# Patient Record
Sex: Male | Born: 1950 | Race: White | Hispanic: No | Marital: Single
Health system: Southern US, Community
[De-identification: ages and names within clinical notes are randomized; demographics above are authoritative.]

## PROBLEM LIST (undated history)

## (undated) DIAGNOSIS — I482 Chronic atrial fibrillation, unspecified: Secondary | ICD-10-CM

## (undated) DIAGNOSIS — J1282 Pneumonia due to coronavirus disease 2019: Secondary | ICD-10-CM

## (undated) DIAGNOSIS — J9621 Acute and chronic respiratory failure with hypoxia: Secondary | ICD-10-CM

## (undated) DIAGNOSIS — N179 Acute kidney failure, unspecified: Secondary | ICD-10-CM

## (undated) DIAGNOSIS — U071 COVID-19: Secondary | ICD-10-CM

---

## 2019-07-28 ENCOUNTER — Other Ambulatory Visit (HOSPITAL_COMMUNITY): Payer: Medicare Other

## 2019-07-28 ENCOUNTER — Inpatient Hospital Stay
Admission: AD | Admit: 2019-07-28 | Discharge: 2019-08-27 | Disposition: A | Discharge status: Rehabilitation Facility | Payer: Medicare Other | Source: Other Acute Inpatient Hospital | Attending: Internal Medicine | Admitting: Internal Medicine

## 2019-07-28 DIAGNOSIS — N179 Acute kidney failure, unspecified: Secondary | ICD-10-CM | POA: Diagnosis present

## 2019-07-28 DIAGNOSIS — J189 Pneumonia, unspecified organism: Secondary | ICD-10-CM

## 2019-07-28 DIAGNOSIS — J9690 Respiratory failure, unspecified, unspecified whether with hypoxia or hypercapnia: Secondary | ICD-10-CM

## 2019-07-28 DIAGNOSIS — J1282 Pneumonia due to coronavirus disease 2019: Secondary | ICD-10-CM | POA: Diagnosis present

## 2019-07-28 DIAGNOSIS — Z431 Encounter for attention to gastrostomy: Secondary | ICD-10-CM

## 2019-07-28 DIAGNOSIS — U071 COVID-19: Secondary | ICD-10-CM | POA: Diagnosis present

## 2019-07-28 DIAGNOSIS — J9621 Acute and chronic respiratory failure with hypoxia: Secondary | ICD-10-CM | POA: Diagnosis present

## 2019-07-28 DIAGNOSIS — I482 Chronic atrial fibrillation, unspecified: Secondary | ICD-10-CM | POA: Diagnosis present

## 2019-07-28 HISTORY — DX: Acute kidney failure, unspecified: N17.9

## 2019-07-28 HISTORY — DX: COVID-19: U07.1

## 2019-07-28 HISTORY — DX: Acute and chronic respiratory failure with hypoxia: J96.21

## 2019-07-28 HISTORY — DX: Pneumonia due to coronavirus disease 2019: J12.82

## 2019-07-28 HISTORY — DX: Chronic atrial fibrillation, unspecified: I48.20

## 2019-07-28 LAB — BLOOD GAS, ARTERIAL
Acid-Base Excess: 11.6 mmol/L — ABNORMAL HIGH (ref 0.0–2.0)
Bicarbonate: 35 mmol/L — ABNORMAL HIGH (ref 20.0–28.0)
FIO2: 45
O2 Saturation: 99.2 %
Patient temperature: 36
pCO2 arterial: 37.5 mmHg (ref 32.0–48.0)
pH, Arterial: 7.573 — ABNORMAL HIGH (ref 7.350–7.450)
pO2, Arterial: 113 mmHg — ABNORMAL HIGH (ref 83.0–108.0)

## 2019-07-29 DIAGNOSIS — J9621 Acute and chronic respiratory failure with hypoxia: Secondary | ICD-10-CM

## 2019-07-29 DIAGNOSIS — J1289 Other viral pneumonia: Secondary | ICD-10-CM

## 2019-07-29 DIAGNOSIS — U071 COVID-19: Secondary | ICD-10-CM

## 2019-07-29 DIAGNOSIS — N179 Acute kidney failure, unspecified: Secondary | ICD-10-CM

## 2019-07-29 DIAGNOSIS — I482 Chronic atrial fibrillation, unspecified: Secondary | ICD-10-CM | POA: Diagnosis not present

## 2019-07-29 LAB — CBC WITH DIFFERENTIAL/PLATELET
Abs Immature Granulocytes: 0.11 10*3/uL — ABNORMAL HIGH (ref 0.00–0.07)
Basophils Absolute: 0.1 10*3/uL (ref 0.0–0.1)
Basophils Relative: 1 %
Eosinophils Absolute: 0.8 10*3/uL — ABNORMAL HIGH (ref 0.0–0.5)
Eosinophils Relative: 7 %
HCT: 33.2 % — ABNORMAL LOW (ref 39.0–52.0)
Hemoglobin: 10.2 g/dL — ABNORMAL LOW (ref 13.0–17.0)
Immature Granulocytes: 1 %
Lymphocytes Relative: 10 %
Lymphs Abs: 1.3 10*3/uL (ref 0.7–4.0)
MCH: 30.6 pg (ref 26.0–34.0)
MCHC: 30.7 g/dL (ref 30.0–36.0)
MCV: 99.7 fL (ref 80.0–100.0)
Monocytes Absolute: 1.7 10*3/uL — ABNORMAL HIGH (ref 0.1–1.0)
Monocytes Relative: 13 %
Neutro Abs: 8.9 10*3/uL — ABNORMAL HIGH (ref 1.7–7.7)
Neutrophils Relative %: 68 %
Platelets: 301 10*3/uL (ref 150–400)
RBC: 3.33 MIL/uL — ABNORMAL LOW (ref 4.22–5.81)
RDW: 15.9 % — ABNORMAL HIGH (ref 11.5–15.5)
WBC: 13 10*3/uL — ABNORMAL HIGH (ref 4.0–10.5)
nRBC: 0 % (ref 0.0–0.2)

## 2019-07-29 LAB — COMPREHENSIVE METABOLIC PANEL
ALT: 62 U/L — ABNORMAL HIGH (ref 0–44)
AST: 64 U/L — ABNORMAL HIGH (ref 15–41)
Albumin: 2.1 g/dL — ABNORMAL LOW (ref 3.5–5.0)
Alkaline Phosphatase: 396 U/L — ABNORMAL HIGH (ref 38–126)
Anion gap: 15 (ref 5–15)
BUN: 40 mg/dL — ABNORMAL HIGH (ref 8–23)
CO2: 29 mmol/L (ref 22–32)
Calcium: 8.7 mg/dL — ABNORMAL LOW (ref 8.9–10.3)
Chloride: 99 mmol/L (ref 98–111)
Creatinine, Ser: 1.08 mg/dL (ref 0.61–1.24)
GFR calc Af Amer: >60 mL/min (ref >60)
GFR calc non Af Amer: >60 mL/min (ref >60)
Glucose, Bld: 89 mg/dL (ref 70–99)
Potassium: 4.2 mmol/L (ref 3.5–5.1)
Sodium: 143 mmol/L (ref 135–145)
Total Bilirubin: 0.8 mg/dL (ref 0.3–1.2)
Total Protein: 6.5 g/dL (ref 6.5–8.1)

## 2019-07-29 LAB — HEMOGLOBIN A1C
Hgb A1c MFr Bld: 6.5 % — ABNORMAL HIGH (ref 4.8–5.6)
Mean Plasma Glucose: 139.85 mg/dL

## 2019-07-29 NOTE — Consult Note (Signed)
[facility]  PULMONARY SERVICE  Date of Service: 07/29/2019  PULMONARY CRITICAL CARE CONSULT   CARLE FENECH  NID:[id number]  DOB: 1951/05/02   DOA: 07/28/2019  Referring Physician: Merton Border, MD  HPI: Kevin Middleton is a 68 y.o. male seen for follow up of Acute on Chronic Respiratory Failure.  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  Patient was found to have multifocal pneumonia and was eventually diagnosed with COVID-19 pneumonia.  Patient also was noted at that time to have atrial fibrillation with rapid ventricular response has not been on anticoagulation as an outpatient.  Patient was treated with remdesivir Decadron and also did receive convalescent plasma.  Patient was not able to be weaned and extubated.  Patient's oxygenation had somewhat improved failed an attempt at extubation.  Other issues that came up included developing bacterial infection for which the patient was treated for this with Zyvox as well as meropenem.  Patient had been apparently weaned down to CPAP sent to [place] for further management and weaning.  Review of Systems:  ROS performed and is unremarkable other than noted above.  Past medical history: Dementia Type 2 diabetes Hypertension Pituitary adenoma Diabetes insipidus Adrenal insufficiency Hypothyroidism Chronic atrial fibrillation COVID-19 pneumonia  Past surgical history: Tracheostomy PEG tube placement Transsphenoidal pituitary resection  Social history: Patient has dementia Unknown about smoking alcohol use  Family history: Noncontributory to the present illness  Allergies: Reviewed on the electronic record  Medications: Reviewed on Rounds  Physical Exam:  Vitals: Temperature 96.0  pulse 80 respiratory rate 27 blood pressure 109/69 saturations 98%  Ventilator Settings mode ventilation assist control FiO2 is 40% tidal line 500 PEEP 5  . General: Comfortable at this time . Eyes: Grossly normal lids, irises & conjunctiva . ENT: grossly tongue is normal . Neck: no obvious mass . Cardiovascular: S1-S2 normal no gallop or rub . Respiratory: No rhonchi no rales are noted at this time . Abdomen: Soft and nontender . Skin: no rash seen on limited exam . Musculoskeletal: not rigid . Psychiatric:unable to assess . Neurologic: no seizure no involuntary movements         Labs on Admission:  Basic Metabolic Panel: No results for input(s): NA, K, CL, CO2, GLUCOSE, BUN, CREATININE, CALCIUM, MG, PHOS in the last 168 hours.  Recent Labs  Lab 07/28/19 1805  PHART 7.573*  PCO2ART 37.5  PO2ART 113*  HCO3 35.0*  O2SAT 99.2    Liver Function Tests: No results for input(s): AST, ALT, ALKPHOS, BILITOT, PROT, ALBUMIN in the last 168 hours. No results for input(s): LIPASE, AMYLASE in the last 168 hours. No results for input(s): AMMONIA in the last 168 hours.  CBC: No results for input(s): WBC, NEUTROABS, HGB, HCT, MCV, PLT in the last 168 hours.  Cardiac Enzymes: No results for input(s): CKTOTAL, CKMB, CKMBINDEX, TROPONINI in the last 168 hours.  BNP (last 3 results) No results for input(s): BNP in the last 8760 hours.  ProBNP (last 3 results) No results for input(s): PROBNP in the last 8760 hours.   Radiological Exams on Admission: Dg Abdomen Peg Tube Location  Result Date: 07/28/2019 CLINICAL DATA:  Peg tube placement EXAM: ABDOMEN - 1 VIEW COMPARISON:  None. FINDINGS: Injection of contrast through the peg tube appears to opacify the proximal small bowel and gastric antrum. The stomach is not  well appreciated on this exam. The bowel gas pattern is nonobstructive. Oral contrast is noted in the colon. IMPRESSION: Injection of contrast through the pre-existing PEG tube  opacifies the proximal small bowel and gastric antrum. The stomach is not well appreciated on this exam and is likely outside the field of view. Short interval repeat radiograph focusing on the upper abdomen may be useful if there is clinical concern for a malpositioned PEG tube. Electronically Signed   By: Katherine Mantle M.D.   On: 07/28/2019 18:36   Dg Chest Port 1 View  Result Date: 07/28/2019 CLINICAL DATA:  Viral pneumonia EXAM: PORTABLE CHEST 1 VIEW COMPARISON:  None. FINDINGS: The tracheostomy tube terminates above the carina. The left-sided PICC line terminates in the SVC. Scattered coarse airspace opacities are noted bilaterally. There are small bilateral pleural effusions, right greater than left. The heart size is enlarged. There is no pneumothorax. No acute osseous abnormality. IMPRESSION: 1. Lines and tubes as above. 2. Coarse lung markings and airspace opacities are noted bilaterally consistent with the patient's history of viral pneumonia. 3. Small bilateral pleural effusions, right greater than left. 4. Cardiomegaly. Electronically Signed   By: Katherine Mantle M.D.   On: 07/28/2019 18:34    Assessment/Plan Active Problems:   Acute on chronic respiratory failure with hypoxemia (HCC)   COVID-19 virus infection   Pneumonia due to COVID-19 virus   Chronic atrial fibrillation (HCC)   Acute renal failure (ARF) (HCC)   1. Acute on chronic respiratory failure hypoxia patient will be assessed with the RSB I to be checked today and try to start with.  Chest x-ray was reviewed still showing significant infiltrates consistent with COVID-19. 2. COVID-19 virus infection in recovery phase we will continue with supportive care 3. COVID-19 viral pneumonia treated chest x-ray still showing residual deficits.  We will continue to monitor 4. Chronic atrial fibrillation by history continue to monitor closely. 5. Acute renal failure we will continue to monitor the labs closely patient had been  seen by nephrology at the other facility  I have personally seen and evaluated the patient, evaluated laboratory and imaging results, formulated the assessment and plan and placed orders. The Patient requires high complexity decision making for assessment and support.  Case was discussed on Rounds with the Respiratory Therapy Staff Time Spent  Yevonne Pax, MD [facility] Pulmonary Critical Care Medicine Sleep Medicine

## 2019-07-30 ENCOUNTER — Encounter: Payer: Self-pay | Admitting: Internal Medicine

## 2019-07-30 DIAGNOSIS — I482 Chronic atrial fibrillation, unspecified: Secondary | ICD-10-CM | POA: Diagnosis not present

## 2019-07-30 DIAGNOSIS — J9621 Acute and chronic respiratory failure with hypoxia: Secondary | ICD-10-CM | POA: Diagnosis present

## 2019-07-30 DIAGNOSIS — U071 COVID-19: Secondary | ICD-10-CM | POA: Diagnosis not present

## 2019-07-30 DIAGNOSIS — J1282 Pneumonia due to coronavirus disease 2019: Secondary | ICD-10-CM | POA: Diagnosis present

## 2019-07-30 DIAGNOSIS — N179 Acute kidney failure, unspecified: Secondary | ICD-10-CM | POA: Diagnosis not present

## 2019-07-30 NOTE — Progress Notes (Addendum)
Pulmonary Critical Care Medicine [facility] [place]   PULMONARY CRITICAL CARE SERVICE  PROGRESS NOTE  Date of Service: 07/30/2019  Kevin Middleton  PNT:[id number]  DOB: 11-Jan-1951   DOA: 07/28/2019  Referring Physician: Carron Curie, MD  HPI: Kevin Middleton is a 68 y.o. male seen for follow up of Acute on Chronic Respiratory Failure.  Patient today is on full support on the ventilator has yet to be weaned  Medications: Reviewed on Rounds  Physical Exam:  Vitals: Temperature 96.7 pulse 85 respiratory rate 32 blood pressure 143/79 saturations 99%  Ventilator Settings mode ventilation assist control FiO2 40% tidal volume 500 PEEP 5  . General: Comfortable at this time . Eyes: Grossly normal lids, irises & conjunctiva . ENT: grossly tongue is normal . Neck: no obvious mass . Cardiovascular: S1 S2 normal no gallop . Respiratory: No rhonchi no rales are noted at this time . Abdomen: soft . Skin: no rash seen on limited exam . Musculoskeletal: not rigid . Psychiatric:unable to assess . Neurologic: no seizure no involuntary movements         Lab Data:   Basic Metabolic Panel: Recent Labs  Lab 07/29/19 0619  NA 143  K 4.2  CL 99  CO2 29  GLUCOSE 89  BUN 40*  CREATININE 1.08  CALCIUM 8.7*    ABG: Recent Labs  Lab 07/28/19 1805  PHART 7.573*  PCO2ART 37.5  PO2ART 113*  HCO3 35.0*  O2SAT 99.2    Liver Function Tests: Recent Labs  Lab 07/29/19 0619  AST 64*  ALT 62*  ALKPHOS 396*  BILITOT 0.8  PROT 6.5  ALBUMIN 2.1*   No results for input(s): LIPASE, AMYLASE in the last 168 hours. No results for input(s): AMMONIA in the last 168 hours.  CBC: Recent Labs  Lab 07/29/19 0619  WBC 13.0*  NEUTROABS 8.9*  HGB 10.2*  HCT 33.2*  MCV 99.7  PLT 301    Cardiac Enzymes: No results for input(s): CKTOTAL, CKMB, CKMBINDEX, TROPONINI in the last 168 hours.  BNP (last 3 results) No results for input(s): BNP in the last 8760  hours.  ProBNP (last 3 results) No results for input(s): PROBNP in the last 8760 hours.  Radiological Exams: Dg Abdomen Peg Tube Location  Result Date: 07/28/2019 CLINICAL DATA:  Peg tube placement EXAM: ABDOMEN - 1 VIEW COMPARISON:  None. FINDINGS: Injection of contrast through the peg tube appears to opacify the proximal small bowel and gastric antrum. The stomach is not well appreciated on this exam. The bowel gas pattern is nonobstructive. Oral contrast is noted in the colon. IMPRESSION: Injection of contrast through the pre-existing PEG tube opacifies the proximal small bowel and gastric antrum. The stomach is not well appreciated on this exam and is likely outside the field of view. Short interval repeat radiograph focusing on the upper abdomen may be useful if there is clinical concern for a malpositioned PEG tube. Electronically Signed   By: Katherine Mantle M.D.   On: 07/28/2019 18:36   Dg Chest Port 1 View  Result Date: 07/28/2019 CLINICAL DATA:  Viral pneumonia EXAM: PORTABLE CHEST 1 VIEW COMPARISON:  None. FINDINGS: The tracheostomy tube terminates above the carina. The left-sided PICC line terminates in the SVC. Scattered coarse airspace opacities are noted bilaterally. There are small bilateral pleural effusions, right greater than left. The heart size is enlarged. There is no pneumothorax. No acute osseous abnormality. IMPRESSION: 1. Lines and tubes as above. 2. Coarse lung markings and airspace opacities  are noted bilaterally consistent with the patient's history of viral pneumonia. 3. Small bilateral pleural effusions, right greater than left. 4. Cardiomegaly. Electronically Signed   By: Constance Holster M.D.   On: 07/28/2019 18:34    Assessment/Plan Active Problems:   Acute on chronic respiratory failure with hypoxemia (HCC)   COVID-19 virus infection   Pneumonia due to COVID-19 virus   Chronic atrial fibrillation (HCC)   Acute renal failure (ARF) ([place])   1. Acute on  chronic respiratory failure with hypoxia we will continue with full support on the ventilator.  Respiratory therapy will check the RSB I did check for weaning readiness 2. COVID-19 virus infection in recovery phase chest x-ray still showing some airspace opacities 3. Pneumonia due to COVID-19 as above residual deficits still noted on the chest x-ray 4. Acute renal failure we will continue to monitor the renal function 5. Chronic atrial fibrillation rate is controlled continue to monitor   I have personally seen and evaluated the patient, evaluated laboratory and imaging results, formulated the assessment and plan and placed orders. The Patient requires high complexity decision making for assessment and support.  Case was discussed on Rounds with the Respiratory Therapy Staff  Allyne Gee, MD [facility] Pulmonary Critical Care Medicine Sleep Medicine

## 2019-07-31 DIAGNOSIS — U071 COVID-19: Secondary | ICD-10-CM | POA: Diagnosis not present

## 2019-07-31 DIAGNOSIS — J9621 Acute and chronic respiratory failure with hypoxia: Secondary | ICD-10-CM | POA: Diagnosis not present

## 2019-07-31 DIAGNOSIS — N179 Acute kidney failure, unspecified: Secondary | ICD-10-CM | POA: Diagnosis not present

## 2019-07-31 DIAGNOSIS — I482 Chronic atrial fibrillation, unspecified: Secondary | ICD-10-CM | POA: Diagnosis not present

## 2019-07-31 NOTE — Progress Notes (Addendum)
Pulmonary Critical Care Medicine [place]   PULMONARY CRITICAL CARE SERVICE  PROGRESS NOTE  Date of Service: 07/31/2019  Kevin Middleton  RSW:[id number]  DOB: November 12, 1950   DOA: 07/28/2019  Referring Physician: Merton Border, MD  HPI: Kevin Middleton is a 68 y.o. male seen for follow up of Acute on Chronic Respiratory Failure.  Patient completed 4 hours on pressure support and is now back on AC VC plus.  Satting well no distress.  Medications: Reviewed on Rounds  Physical Exam:  Vitals: Pulse 70 respirations 41 BP 148/77 O2 sat 100% temp 96.1  Ventilator Settings ventilator mode AC VC plus rate 14 tidal volume 500 PEEP of 5 and FiO2 40%  . General: Comfortable at this time . Eyes: Grossly normal lids, irises & conjunctiva . ENT: grossly tongue is normal . Neck: no obvious mass . Cardiovascular: S1 S2 normal no gallop . Respiratory: No rales or rhonchi noted . Abdomen: soft . Skin: no rash seen on limited exam . Musculoskeletal: not rigid . Psychiatric:unable to assess . Neurologic: no seizure no involuntary movements         Lab Data:   Basic Metabolic Panel: Recent Labs  Lab 07/29/19 0619  NA 143  K 4.2  CL 99  CO2 29  GLUCOSE 89  BUN 40*  CREATININE 1.08  CALCIUM 8.7*    ABG: Recent Labs  Lab 07/28/19 1805  PHART 7.573*  PCO2ART 37.5  PO2ART 113*  HCO3 35.0*  O2SAT 99.2    Liver Function Tests: Recent Labs  Lab 07/29/19 0619  AST 64*  ALT 62*  ALKPHOS 396*  BILITOT 0.8  PROT 6.5  ALBUMIN 2.1*   No results for input(s): LIPASE, AMYLASE in the last 168 hours. No results for input(s): AMMONIA in the last 168 hours.  CBC: Recent Labs  Lab 07/29/19 0619  WBC 13.0*  NEUTROABS 8.9*  HGB 10.2*  HCT 33.2*  MCV 99.7  PLT 301    Cardiac Enzymes: No results for input(s): CKTOTAL, CKMB, CKMBINDEX, TROPONINI in the last 168 hours.  BNP (last 3 results) No results for input(s): BNP in the last 8760 hours.  ProBNP  (last 3 results) No results for input(s): PROBNP in the last 8760 hours.  Radiological Exams: No results found.  Assessment/Plan Active Problems:   Acute on chronic respiratory failure with hypoxemia (HCC)   COVID-19 virus infection   Pneumonia due to COVID-19 virus   Chronic atrial fibrillation (HCC)   Acute renal failure (ARF) ([place])   1. Acute on chronic respiratory failure with hypoxia continue with full support on ventilator as needed and continue to attempt weaning per protocol.  Completed 4 hours on pressure support today.  Continue present pulmonary toilet and supportive measures. 2. COVID-19 virus infection in recovery phase chest x-ray still showing some airspace opacities 3. Pneumonia due to COVID-19 as above residual deficits still noted on the chest x-ray 4. Acute renal failure we will continue to monitor the renal function 5. Chronic atrial fibrillation rate is controlled continue to monitor   I have personally seen and evaluated the patient, evaluated laboratory and imaging results, formulated the assessment and plan and placed orders. The Patient requires high complexity decision making for assessment and support.  Case was discussed on Rounds with the Respiratory Therapy Staff  Allyne Gee, MD [facility] Pulmonary Critical Care Medicine Sleep Medicine

## 2019-08-01 DIAGNOSIS — N179 Acute kidney failure, unspecified: Secondary | ICD-10-CM | POA: Diagnosis not present

## 2019-08-01 DIAGNOSIS — U071 COVID-19: Secondary | ICD-10-CM | POA: Diagnosis not present

## 2019-08-01 DIAGNOSIS — J9621 Acute and chronic respiratory failure with hypoxia: Secondary | ICD-10-CM | POA: Diagnosis not present

## 2019-08-01 DIAGNOSIS — I482 Chronic atrial fibrillation, unspecified: Secondary | ICD-10-CM | POA: Diagnosis not present

## 2019-08-01 NOTE — Progress Notes (Addendum)
Pulmonary Critical Care Medicine [place]   PULMONARY CRITICAL CARE SERVICE  PROGRESS NOTE  Date of Service: 08/01/2019  Kevin Middleton  DDU:[id number]  DOB: 05-15-1951   DOA: 07/28/2019  Referring Physician: Merton Border, MD  HPI: Kevin Middleton is a 68 y.o. male seen for follow up of Acute on Chronic Respiratory Failure.  Patient completed 4 hours yesterday on pressure support has a goal of 8 hours today on 12/5 with an FiO2 of 40%.  Medications: Reviewed on Rounds  Physical Exam:  Vitals: Pulse 76 respirations 26 BP 90/74 O2 sat 99% 97.0  Ventilator Settings pressure support 12/5 FiO2 40%  . General: Comfortable at this time . Eyes: Grossly normal lids, irises & conjunctiva . ENT: grossly tongue is normal . Neck: no obvious mass . Cardiovascular: S1 S2 normal no gallop . Respiratory: No rales or rhonchi noted . Abdomen: soft . Skin: no rash seen on limited exam . Musculoskeletal: not rigid . Psychiatric:unable to assess . Neurologic: no seizure no involuntary movements         Lab Data:   Basic Metabolic Panel: Recent Labs  Lab 07/29/19 0619  NA 143  K 4.2  CL 99  CO2 29  GLUCOSE 89  BUN 40*  CREATININE 1.08  CALCIUM 8.7*    ABG: Recent Labs  Lab 07/28/19 1805  PHART 7.573*  PCO2ART 37.5  PO2ART 113*  HCO3 35.0*  O2SAT 99.2    Liver Function Tests: Recent Labs  Lab 07/29/19 0619  AST 64*  ALT 62*  ALKPHOS 396*  BILITOT 0.8  PROT 6.5  ALBUMIN 2.1*   No results for input(s): LIPASE, AMYLASE in the last 168 hours. No results for input(s): AMMONIA in the last 168 hours.  CBC: Recent Labs  Lab 07/29/19 0619  WBC 13.0*  NEUTROABS 8.9*  HGB 10.2*  HCT 33.2*  MCV 99.7  PLT 301    Cardiac Enzymes: No results for input(s): CKTOTAL, CKMB, CKMBINDEX, TROPONINI in the last 168 hours.  BNP (last 3 results) No results for input(s): BNP in the last 8760 hours.  ProBNP (last 3 results) No results for input(s):  PROBNP in the last 8760 hours.  Radiological Exams: No results found.  Assessment/Plan Active Problems:   Acute on chronic respiratory failure with hypoxemia (HCC)   COVID-19 virus infection   Pneumonia due to COVID-19 virus   Chronic atrial fibrillation (HCC)   Acute renal failure (ARF) ([place])   1. Acute on chronic respiratory failure with hypoxia continue with full support on ventilator as needed and continue to attempt weaning per protocol.    Goal of 8 hours today on pressure support.  Continue support measures pulmonary toilet. 2. COVID-19 virus infection in recovery phase chest x-ray still showing some airspace opacities 3. Pneumonia due to COVID-19 as above residual deficits still noted on the chest x-ray 4. Acute renal failure we will continue to monitor the renal function 5. Chronic atrial fibrillation rate is controlled continue to monitor   I have personally seen and evaluated the patient, evaluated laboratory and imaging results, formulated the assessment and plan and placed orders. The Patient requires high complexity decision making for assessment and support.  Case was discussed on Rounds with the Respiratory Therapy Staff  Allyne Gee, MD [facility] Pulmonary Critical Care Medicine Sleep Medicine

## 2019-08-02 ENCOUNTER — Other Ambulatory Visit (HOSPITAL_COMMUNITY): Payer: Medicare Other

## 2019-08-02 DIAGNOSIS — N179 Acute kidney failure, unspecified: Secondary | ICD-10-CM | POA: Diagnosis not present

## 2019-08-02 DIAGNOSIS — I482 Chronic atrial fibrillation, unspecified: Secondary | ICD-10-CM | POA: Diagnosis not present

## 2019-08-02 DIAGNOSIS — U071 COVID-19: Secondary | ICD-10-CM | POA: Diagnosis not present

## 2019-08-02 DIAGNOSIS — J9621 Acute and chronic respiratory failure with hypoxia: Secondary | ICD-10-CM | POA: Diagnosis not present

## 2019-08-02 LAB — BASIC METABOLIC PANEL
Anion gap: 12 (ref 5–15)
BUN: 36 mg/dL — ABNORMAL HIGH (ref 8–23)
CO2: 31 mmol/L (ref 22–32)
Calcium: 8.8 mg/dL — ABNORMAL LOW (ref 8.9–10.3)
Chloride: 99 mmol/L (ref 98–111)
Creatinine, Ser: 1.15 mg/dL (ref 0.61–1.24)
GFR calc Af Amer: >60 mL/min (ref >60)
GFR calc non Af Amer: >60 mL/min (ref >60)
Glucose, Bld: 152 mg/dL — ABNORMAL HIGH (ref 70–99)
Potassium: 3.5 mmol/L (ref 3.5–5.1)
Sodium: 142 mmol/L (ref 135–145)

## 2019-08-02 NOTE — Progress Notes (Signed)
Pulmonary Critical Care Medicine [facility] [place]   PULMONARY CRITICAL CARE SERVICE  PROGRESS NOTE  Date of Service: 08/02/2019  Kevin Middleton  ION:[id number]  DOB: 08-04-1951   DOA: 07/28/2019  Referring Physician: Carron Curie, MD  HPI: Kevin Middleton is a 68 y.o. male seen for follow up of Acute on Chronic Respiratory Failure.  Patient currently is on pressure support mode has been on 40% FiO2 with pressure support of 12/5 spoke with respiratory therapy going to remove sutures today try to advance weaning  Medications: Reviewed on Rounds  Physical Exam:  Vitals: Temperature 96.8 pulse 82 respiratory rate 30 blood pressure 102/78 saturations 100%  Ventilator Settings mode ventilation pressure support FiO2 40% pressure 12 PEEP 5  . General: Comfortable at this time . Eyes: Grossly normal lids, irises & conjunctiva . ENT: grossly tongue is normal . Neck: no obvious mass . Cardiovascular: S1 S2 normal no gallop . Respiratory: No rhonchi coarse breath sounds . Abdomen: soft . Skin: no rash seen on limited exam . Musculoskeletal: not rigid . Psychiatric:unable to assess . Neurologic: no seizure no involuntary movements         Lab Data:   Basic Metabolic Panel: Recent Labs  Lab 07/29/19 0619  NA 143  K 4.2  CL 99  CO2 29  GLUCOSE 89  BUN 40*  CREATININE 1.08  CALCIUM 8.7*    ABG: Recent Labs  Lab 07/28/19 1805  PHART 7.573*  PCO2ART 37.5  PO2ART 113*  HCO3 35.0*  O2SAT 99.2    Liver Function Tests: Recent Labs  Lab 07/29/19 0619  AST 64*  ALT 62*  ALKPHOS 396*  BILITOT 0.8  PROT 6.5  ALBUMIN 2.1*   No results for input(s): LIPASE, AMYLASE in the last 168 hours. No results for input(s): AMMONIA in the last 168 hours.  CBC: Recent Labs  Lab 07/29/19 0619  WBC 13.0*  NEUTROABS 8.9*  HGB 10.2*  HCT 33.2*  MCV 99.7  PLT 301    Cardiac Enzymes: No results for input(s): CKTOTAL, CKMB, CKMBINDEX, TROPONINI in the last  168 hours.  BNP (last 3 results) No results for input(s): BNP in the last 8760 hours.  ProBNP (last 3 results) No results for input(s): PROBNP in the last 8760 hours.  Radiological Exams: Dg Chest Port 1 View  Result Date: 08/02/2019 CLINICAL DATA:  COVID-19 pneumonia. EXAM: PORTABLE CHEST 1 VIEW COMPARISON:  07/28/2019 FINDINGS: Tracheostomy tube in place, unchanged. PICC has been removed. There small faint patchy areas of infiltrate in both lungs, improved on the left. The heart size and pulmonary vascularity are normal. No definitive effusions although there is slight blunting of the right costophrenic angle laterally. No acute bone abnormality. IMPRESSION: Small patchy areas of infiltrate in both lungs, improved on the left. Electronically Signed   By: Francene Boyers M.D.   On: 08/02/2019 09:07    Assessment/Plan Active Problems:   Acute on chronic respiratory failure with hypoxemia (HCC)   COVID-19 virus infection   Pneumonia due to COVID-19 virus   Chronic atrial fibrillation (HCC)   Acute renal failure (ARF) (HCC)   1. Acute on chronic respiratory failure with hypoxia we will continue with the weaning as tolerated patient is going to have sutures removed and will continue to advance weaning possibly T collar 2. COVID-19 virus infection in recovery phase 3. Pneumonia due to COVID-19 still with small areas of patchy infiltrate in both lungs some improvement on the left was noted 4. Chronic atrial  fibrillation rate controlled 5. Acute renal failure resolved   I have personally seen and evaluated the patient, evaluated laboratory and imaging results, formulated the assessment and plan and placed orders. The Patient requires high complexity decision making for assessment and support.  Case was discussed on Rounds with the Respiratory Therapy Staff  Allyne Gee, MD [facility] Pulmonary Critical Care Medicine Sleep Medicine

## 2019-08-03 DIAGNOSIS — N179 Acute kidney failure, unspecified: Secondary | ICD-10-CM | POA: Diagnosis not present

## 2019-08-03 DIAGNOSIS — J9621 Acute and chronic respiratory failure with hypoxia: Secondary | ICD-10-CM | POA: Diagnosis not present

## 2019-08-03 DIAGNOSIS — I482 Chronic atrial fibrillation, unspecified: Secondary | ICD-10-CM | POA: Diagnosis not present

## 2019-08-03 DIAGNOSIS — U071 COVID-19: Secondary | ICD-10-CM | POA: Diagnosis not present

## 2019-08-03 LAB — BASIC METABOLIC PANEL
Anion gap: 13 (ref 5–15)
BUN: 35 mg/dL — ABNORMAL HIGH (ref 8–23)
CO2: 31 mmol/L (ref 22–32)
Calcium: 8.8 mg/dL — ABNORMAL LOW (ref 8.9–10.3)
Chloride: 99 mmol/L (ref 98–111)
Creatinine, Ser: 1.13 mg/dL (ref 0.61–1.24)
GFR calc Af Amer: >60 mL/min (ref >60)
GFR calc non Af Amer: >60 mL/min (ref >60)
Glucose, Bld: 172 mg/dL — ABNORMAL HIGH (ref 70–99)
Potassium: 3.8 mmol/L (ref 3.5–5.1)
Sodium: 143 mmol/L (ref 135–145)

## 2019-08-03 LAB — CBC
HCT: 29.1 % — ABNORMAL LOW (ref 39.0–52.0)
Hemoglobin: 9.1 g/dL — ABNORMAL LOW (ref 13.0–17.0)
MCH: 30 pg (ref 26.0–34.0)
MCHC: 31.3 g/dL (ref 30.0–36.0)
MCV: 96 fL (ref 80.0–100.0)
Platelets: 321 10*3/uL (ref 150–400)
RBC: 3.03 MIL/uL — ABNORMAL LOW (ref 4.22–5.81)
RDW: 15.5 % (ref 11.5–15.5)
WBC: 10.6 10*3/uL — ABNORMAL HIGH (ref 4.0–10.5)
nRBC: 0 % (ref 0.0–0.2)

## 2019-08-03 NOTE — Progress Notes (Addendum)
Pulmonary Critical Care Medicine [facility]   PULMONARY CRITICAL CARE SERVICE  PROGRESS NOTE  Date of Service: 08/03/2019  COMMODORE BELLEW  EGB:[id number]  DOB: September 08, 1951   DOA: 07/28/2019  Referring Physician: Carron Curie, MD  HPI: Kevin Middleton is a 68 y.o. male seen for follow up of Acute on Chronic Respiratory Failure.  Patient is currently on pressure support for 16-hour goal satting well with no distress.  Medications: Reviewed on Rounds  Physical Exam:  Vitals: Pulse 77 respirations 30 BP 116/69 O2 sat 90% temp 97.1  Ventilator Settings pressure support 12/5 FiO2 30%  . General: Comfortable at this time . Eyes: Grossly normal lids, irises & conjunctiva . ENT: grossly tongue is normal . Neck: no obvious mass . Cardiovascular: S1 S2 normal no gallop . Respiratory: No rales rhonchi noted . Abdomen: soft . Skin: no rash seen on limited exam . Musculoskeletal: not rigid . Psychiatric:unable to assess . Neurologic: no seizure no involuntary movements         Lab Data:   Basic Metabolic Panel: Recent Labs  Lab 07/29/19 0619 08/02/19 1457 08/03/19 0421  NA 143 142 143  K 4.2 3.5 3.8  CL 99 99 99  CO2 29 31 31   GLUCOSE 89 152* 172*  BUN 40* 36* 35*  CREATININE 1.08 1.15 1.13  CALCIUM 8.7* 8.8* 8.8*    ABG: Recent Labs  Lab 07/28/19 1805  PHART 7.573*  PCO2ART 37.5  PO2ART 113*  HCO3 35.0*  O2SAT 99.2    Liver Function Tests: Recent Labs  Lab 07/29/19 0619  AST 64*  ALT 62*  ALKPHOS 396*  BILITOT 0.8  PROT 6.5  ALBUMIN 2.1*   No results for input(s): LIPASE, AMYLASE in the last 168 hours. No results for input(s): AMMONIA in the last 168 hours.  CBC: Recent Labs  Lab 07/29/19 0619 08/03/19 0421  WBC 13.0* 10.6*  NEUTROABS 8.9*  --   HGB 10.2* 9.1*  HCT 33.2* 29.1*  MCV 99.7 96.0  PLT 301 321    Cardiac Enzymes: No results for input(s): CKTOTAL, CKMB, CKMBINDEX, TROPONINI in the last 168 hours.  BNP  (last 3 results) No results for input(s): BNP in the last 8760 hours.  ProBNP (last 3 results) No results for input(s): PROBNP in the last 8760 hours.  Radiological Exams: Dg Chest Port 1 View  Result Date: 08/02/2019 CLINICAL DATA:  COVID-19 pneumonia. EXAM: PORTABLE CHEST 1 VIEW COMPARISON:  07/28/2019 FINDINGS: Tracheostomy tube in place, unchanged. PICC has been removed. There small faint patchy areas of infiltrate in both lungs, improved on the left. The heart size and pulmonary vascularity are normal. No definitive effusions although there is slight blunting of the right costophrenic angle laterally. No acute bone abnormality. IMPRESSION: Small patchy areas of infiltrate in both lungs, improved on the left. Electronically Signed   By: 13/07/2019 M.D.   On: 08/02/2019 09:07    Assessment/Plan Active Problems:   Acute on chronic respiratory failure with hypoxemia (HCC)   COVID-19 virus infection   Pneumonia due to COVID-19 virus   Chronic atrial fibrillation (HCC)   Acute renal failure (ARF) (HCC)   1. Acute on chronic respiratory failure with hypoxia patient continue on weaning per protocol.  Has a goal of 16 hours today.  We will continue aggressive pulmonary and supportive measures. 2. COVID-19 virus infection in recovery phase 3. Pneumonia due to COVID-19 still with small areas of patchy infiltrate in both lungs some improvement on the  left was noted 4. Chronic atrial fibrillation rate controlled 5. Acute renal failure resolved   I have personally seen and evaluated the patient, evaluated laboratory and imaging results, formulated the assessment and plan and placed orders. The Patient requires high complexity decision making for assessment and support.  Case was discussed on Rounds with the Respiratory Therapy Staff  Allyne Gee, MD [facility] Pulmonary Critical Care Medicine Sleep Medicine

## 2019-08-04 DIAGNOSIS — U071 COVID-19: Secondary | ICD-10-CM | POA: Diagnosis not present

## 2019-08-04 DIAGNOSIS — J9621 Acute and chronic respiratory failure with hypoxia: Secondary | ICD-10-CM | POA: Diagnosis not present

## 2019-08-04 DIAGNOSIS — I482 Chronic atrial fibrillation, unspecified: Secondary | ICD-10-CM | POA: Diagnosis not present

## 2019-08-04 DIAGNOSIS — N179 Acute kidney failure, unspecified: Secondary | ICD-10-CM | POA: Diagnosis not present

## 2019-08-04 NOTE — Progress Notes (Signed)
Pulmonary Critical Care Medicine [place]   PULMONARY CRITICAL CARE SERVICE  PROGRESS NOTE  Date of Service: 08/04/2019  PRATHER FAILLA  ION:[id number]  DOB: 1951/03/29   DOA: 07/28/2019  Referring Physician: Merton Border, MD  HPI: FRIEDRICH HARRIOTT is a 68 y.o. male seen for follow up of Acute on Chronic Respiratory Failure.  Patient currently is on nag device and has been using about 2 hours the goal is for up to 6 hours when  Medications: Reviewed on Rounds  Physical Exam:  Vitals: Temperature 97.4 pulse 79 respiratory 20 blood pressure 132/81 saturations 94%  Ventilator Settings off the ventilator right now on mag device  . General: Comfortable at this time . Eyes: Grossly normal lids, irises & conjunctiva . ENT: grossly tongue is normal . Neck: no obvious mass . Cardiovascular: S1 S2 normal no gallop . Respiratory: No rhonchi coarse breath sounds . Abdomen: soft . Skin: no rash seen on limited exam . Musculoskeletal: not rigid . Psychiatric:unable to assess . Neurologic: no seizure no involuntary movements         Lab Data:   Basic Metabolic Panel: Recent Labs  Lab 07/29/19 0619 08/02/19 1457 08/03/19 0421  NA 143 142 143  K 4.2 3.5 3.8  CL 99 99 99  CO2 29 31 31   GLUCOSE 89 152* 172*  BUN 40* 36* 35*  CREATININE 1.08 1.15 1.13  CALCIUM 8.7* 8.8* 8.8*    ABG: Recent Labs  Lab 07/28/19 1805  PHART 7.573*  PCO2ART 37.5  PO2ART 113*  HCO3 35.0*  O2SAT 99.2    Liver Function Tests: Recent Labs  Lab 07/29/19 0619  AST 64*  ALT 62*  ALKPHOS 396*  BILITOT 0.8  PROT 6.5  ALBUMIN 2.1*   No results for input(s): LIPASE, AMYLASE in the last 168 hours. No results for input(s): AMMONIA in the last 168 hours.  CBC: Recent Labs  Lab 07/29/19 0619 08/03/19 0421  WBC 13.0* 10.6*  NEUTROABS 8.9*  --   HGB 10.2* 9.1*  HCT 33.2* 29.1*  MCV 99.7 96.0  PLT 301 321    Cardiac Enzymes: No results for input(s): CKTOTAL,  CKMB, CKMBINDEX, TROPONINI in the last 168 hours.  BNP (last 3 results) No results for input(s): BNP in the last 8760 hours.  ProBNP (last 3 results) No results for input(s): PROBNP in the last 8760 hours.  Radiological Exams: No results found.  Assessment/Plan Active Problems:   Acute on chronic respiratory failure with hypoxemia (HCC)   COVID-19 virus infection   Pneumonia due to COVID-19 virus   Chronic atrial fibrillation (HCC)   Acute renal failure (ARF) ([place])   1. Acute on chronic respiratory failure with hypoxia we will continue with mag device as tolerated. 2. COVID-19 virus infection in resolution phase 3. Pneumonia due to COVID-19 treated improving 4. Chronic atrial fibrillation rate controlled 5. Acute renal failure labs are at baseline we will monitor   I have personally seen and evaluated the patient, evaluated laboratory and imaging results, formulated the assessment and plan and placed orders. The Patient requires high complexity decision making for assessment and support.  Case was discussed on Rounds with the Respiratory Therapy Staff  Allyne Gee, MD [facility] Pulmonary Critical Care Medicine Sleep Medicine

## 2019-08-05 DIAGNOSIS — N179 Acute kidney failure, unspecified: Secondary | ICD-10-CM | POA: Diagnosis not present

## 2019-08-05 DIAGNOSIS — I482 Chronic atrial fibrillation, unspecified: Secondary | ICD-10-CM | POA: Diagnosis not present

## 2019-08-05 DIAGNOSIS — J9621 Acute and chronic respiratory failure with hypoxia: Secondary | ICD-10-CM | POA: Diagnosis not present

## 2019-08-05 DIAGNOSIS — U071 COVID-19: Secondary | ICD-10-CM | POA: Diagnosis not present

## 2019-08-05 NOTE — Progress Notes (Signed)
Pulmonary Critical Care Medicine [place]   PULMONARY CRITICAL CARE SERVICE  PROGRESS NOTE  Date of Service: 08/05/2019  [name]  WJX:[id number]  DOB: 25-Sep-1950   DOA: 07/28/2019  Referring Physician: Merton Border, MD  HPI: Kevin Middleton is a 68 y.o. male seen for follow up of Acute on Chronic Respiratory Failure.  Patient was resting on the ventilator this morning supposed to go back on the nag device  Medications: Reviewed on Rounds  Physical Exam:  Vitals: Temperature 96.7 pulse is 75 respiratory 25 blood pressure 116/69 saturations 97%  Ventilator Settings mode ventilation assist control FiO2 28% tidal volume 412 PEEP 5  . General: Comfortable at this time . Eyes: Grossly normal lids, irises & conjunctiva . ENT: grossly tongue is normal . Neck: no obvious mass . Cardiovascular: S1 S2 normal no gallop . Respiratory: No rhonchi no rales are noted at this time . Abdomen: soft . Skin: no rash seen on limited exam . Musculoskeletal: not rigid . Psychiatric:unable to assess . Neurologic: no seizure no involuntary movements         Lab Data:   Basic Metabolic Panel: Recent Labs  Lab 08/02/19 1457 08/03/19 0421  NA 142 143  K 3.5 3.8  CL 99 99  CO2 31 31  GLUCOSE 152* 172*  BUN 36* 35*  CREATININE 1.15 1.13  CALCIUM 8.8* 8.8*    ABG: No results for input(s): PHART, PCO2ART, PO2ART, HCO3, O2SAT in the last 168 hours.  Liver Function Tests: No results for input(s): AST, ALT, ALKPHOS, BILITOT, PROT, ALBUMIN in the last 168 hours. No results for input(s): LIPASE, AMYLASE in the last 168 hours. No results for input(s): AMMONIA in the last 168 hours.  CBC: Recent Labs  Lab 08/03/19 0421  WBC 10.6*  HGB 9.1*  HCT 29.1*  MCV 96.0  PLT 321    Cardiac Enzymes: No results for input(s): CKTOTAL, CKMB, CKMBINDEX, TROPONINI in the last 168 hours.  BNP (last 3 results) No results for input(s): BNP in the last 8760  hours.  ProBNP (last 3 results) No results for input(s): PROBNP in the last 8760 hours.  Radiological Exams: No results found.  Assessment/Plan Active Problems:   Acute on chronic respiratory failure with hypoxemia (HCC)   COVID-19 virus infection   Pneumonia due to COVID-19 virus   Chronic atrial fibrillation (HCC)   Acute renal failure (ARF) ([place])   1. Acute on chronic respiratory failure with hypoxia patient is going to continue with the weaning on nag supposed to do up to 12 hours today 2. COVID-19 virus infection in resolution phase 3. Pneumonia due to COVID-19 treated 4. Chronic atrial fibrillation rate controlled 5. Acute renal failure continue with supportive care monitor the labs   I have personally seen and evaluated the patient, evaluated laboratory and imaging results, formulated the assessment and plan and placed orders. The Patient requires high complexity decision making for assessment and support.  Case was discussed on Rounds with the Respiratory Therapy Staff  Allyne Gee, MD [facility] Pulmonary Critical Care Medicine Sleep Medicine

## 2019-08-06 DIAGNOSIS — U071 COVID-19: Secondary | ICD-10-CM | POA: Diagnosis not present

## 2019-08-06 DIAGNOSIS — J9621 Acute and chronic respiratory failure with hypoxia: Secondary | ICD-10-CM | POA: Diagnosis not present

## 2019-08-06 DIAGNOSIS — N179 Acute kidney failure, unspecified: Secondary | ICD-10-CM | POA: Diagnosis not present

## 2019-08-06 DIAGNOSIS — I482 Chronic atrial fibrillation, unspecified: Secondary | ICD-10-CM | POA: Diagnosis not present

## 2019-08-06 NOTE — Progress Notes (Addendum)
Pulmonary Critical Care Medicine [name]   PULMONARY CRITICAL CARE SERVICE  PROGRESS NOTE  Date of Service: 08/06/2019  LABRADFORD SCHNITKER  IDP:[id number]  DOB: [birth year]   DOA: 07/28/2019  Referring Physician: Merton Border, MD  HPI: Kevin Middleton is a 68 y.o. male seen for follow up of Acute on Chronic Respiratory Failure.  Patient is a 16-hour goal today on NAG 4 L.  Currently satting well no distress.  Medications: Reviewed on Rounds  Physical Exam:  Vitals: Pulse of 5 respirations 25 BP 130/67 O2 sat 97% temp 96.9  Ventilator Settings nag 4 L  . General: Comfortable at this time . Eyes: Grossly normal lids, irises & conjunctiva . ENT: grossly tongue is normal . Neck: no obvious mass . Cardiovascular: S1 S2 normal no gallop . Respiratory: No rales or rhonchi . Abdomen: soft . Skin: no rash seen on limited exam . Musculoskeletal: not rigid . Psychiatric:unable to assess . Neurologic: no seizure no involuntary movements         Lab Data:   Basic Metabolic Panel: Recent Labs  Lab 08/02/19 1457 08/03/19 0421  NA 142 143  K 3.5 3.8  CL 99 99  CO2 31 31  GLUCOSE 152* 172*  BUN 36* 35*  CREATININE 1.15 1.13  CALCIUM 8.8* 8.8*    ABG: No results for input(s): PHART, PCO2ART, PO2ART, HCO3, O2SAT in the last 168 hours.  Liver Function Tests: No results for input(s): AST, ALT, ALKPHOS, BILITOT, PROT, ALBUMIN in the last 168 hours. No results for input(s): LIPASE, AMYLASE in the last 168 hours. No results for input(s): AMMONIA in the last 168 hours.  CBC: Recent Labs  Lab 08/03/19 0421  WBC 10.6*  HGB 9.1*  HCT 29.1*  MCV 96.0  PLT 321    Cardiac Enzymes: No results for input(s): CKTOTAL, CKMB, CKMBINDEX, TROPONINI in the last 168 hours.  BNP (last 3 results) No results for input(s): BNP in the last 8760 hours.  ProBNP (last 3 results) No results for input(s): PROBNP in the last 8760 hours.  Radiological Exams: No  results found.  Assessment/Plan Active Problems:   Acute on chronic respiratory failure with hypoxemia (HCC)   COVID-19 virus infection   Pneumonia due to COVID-19 virus   Chronic atrial fibrillation (HCC)   Acute renal failure (ARF) ([place])   1. Acute on chronic respiratory failure with hypoxia patient is going to continue with the weaning on nag supposed to do up to 16 hours today 2. COVID-19 virus infection in resolution phase 3. Pneumonia due to COVID-19 treated 4. Chronic atrial fibrillation rate controlled 5. Acute renal failure continue with supportive care monitor the labs   I have personally seen and evaluated the patient, evaluated laboratory and imaging results, formulated the assessment and plan and placed orders. The Patient requires high complexity decision making for assessment and support.  Case was discussed on Rounds with the Respiratory Therapy Staff  Allyne Gee, MD [facility] Pulmonary Critical Care Medicine Sleep Medicine

## 2019-08-07 DIAGNOSIS — I482 Chronic atrial fibrillation, unspecified: Secondary | ICD-10-CM | POA: Diagnosis not present

## 2019-08-07 DIAGNOSIS — N179 Acute kidney failure, unspecified: Secondary | ICD-10-CM | POA: Diagnosis not present

## 2019-08-07 DIAGNOSIS — J9621 Acute and chronic respiratory failure with hypoxia: Secondary | ICD-10-CM | POA: Diagnosis not present

## 2019-08-07 DIAGNOSIS — U071 COVID-19: Secondary | ICD-10-CM | POA: Diagnosis not present

## 2019-08-07 NOTE — Progress Notes (Addendum)
Pulmonary Critical Care Medicine [place]   PULMONARY CRITICAL CARE SERVICE  PROGRESS NOTE  Date of Service: 08/07/2019  Kevin Middleton  RCV:[id number]  DOB: [birth year]   DOA: 07/28/2019  Referring Physician: Merton Border, MD  HPI: Kevin Middleton is a 68 y.o. male seen for follow up of Acute on Chronic Respiratory Failure.  Patient is now completed 24 hours on NAG at 3 L using PMV with no difficulty.  Has increased secretions however is satting well no distress.  Medications: Reviewed on Rounds  Physical Exam:  Vitals: Pulse 67 respirations 30 BP one 1/67 O2 sat 100% temp 96.4  Ventilator Settings NAG 3 L  . General: Comfortable at this time . Eyes: Grossly normal lids, irises & conjunctiva . ENT: grossly tongue is normal . Neck: no obvious mass . Cardiovascular: S1 S2 normal no gallop . Respiratory: No rales or rhonchi noted . Abdomen: soft . Skin: no rash seen on limited exam . Musculoskeletal: not rigid . Psychiatric:unable to assess . Neurologic: no seizure no involuntary movements         Lab Data:   Basic Metabolic Panel: Recent Labs  Lab 08/02/19 1457 08/03/19 0421  NA 142 143  K 3.5 3.8  CL 99 99  CO2 31 31  GLUCOSE 152* 172*  BUN 36* 35*  CREATININE 1.15 1.13  CALCIUM 8.8* 8.8*    ABG: No results for input(s): PHART, PCO2ART, PO2ART, HCO3, O2SAT in the last 168 hours.  Liver Function Tests: No results for input(s): AST, ALT, ALKPHOS, BILITOT, PROT, ALBUMIN in the last 168 hours. No results for input(s): LIPASE, AMYLASE in the last 168 hours. No results for input(s): AMMONIA in the last 168 hours.  CBC: Recent Labs  Lab 08/03/19 0421  WBC 10.6*  HGB 9.1*  HCT 29.1*  MCV 96.0  PLT 321    Cardiac Enzymes: No results for input(s): CKTOTAL, CKMB, CKMBINDEX, TROPONINI in the last 168 hours.  BNP (last 3 results) No results for input(s): BNP in the last 8760 hours.  ProBNP (last 3 results) No results for  input(s): PROBNP in the last 8760 hours.  Radiological Exams: No results found.  Assessment/Plan Active Problems:   Acute on chronic respiratory failure with hypoxemia (HCC)   COVID-19 virus infection   Pneumonia due to COVID-19 virus   Chronic atrial fibrillation (HCC)   Acute renal failure (ARF) ([place])   1. Acute on chronic respiratory failure with hypoxia patient continues to wear NAG at this time has been over 24 hours.  Currently satting well no distress continue supportive measures, toilet at this time. 2. COVID-19 virus infection in resolution phase 3. Pneumonia due to COVID-19 treated 4. Chronic atrial fibrillation rate controlled 5. Acute renal failure continue with supportive care monitor the labs   I have personally seen and evaluated the patient, evaluated laboratory and imaging results, formulated the assessment and plan and placed orders. The Patient requires high complexity decision making for assessment and support.  Case was discussed on Rounds with the Respiratory Therapy Staff  Allyne Gee, MD [facility] Pulmonary Critical Care Medicine Sleep Medicine

## 2019-08-08 DIAGNOSIS — J9621 Acute and chronic respiratory failure with hypoxia: Secondary | ICD-10-CM | POA: Diagnosis not present

## 2019-08-08 DIAGNOSIS — I482 Chronic atrial fibrillation, unspecified: Secondary | ICD-10-CM | POA: Diagnosis not present

## 2019-08-08 DIAGNOSIS — U071 COVID-19: Secondary | ICD-10-CM | POA: Diagnosis not present

## 2019-08-08 DIAGNOSIS — N179 Acute kidney failure, unspecified: Secondary | ICD-10-CM | POA: Diagnosis not present

## 2019-08-08 NOTE — Progress Notes (Signed)
Pulmonary Critical Care Medicine [place]   PULMONARY CRITICAL CARE SERVICE  PROGRESS NOTE  Date of Service: 08/08/2019  Kevin Middleton  CVE:[id number]  DOB: [birth year]   DOA: 07/28/2019  Referring Physician: Merton Border, MD  HPI: Kevin Middleton is a 68 y.o. male seen for follow up of Acute on Chronic Respiratory Failure.  Patient continues on 3 L oxygen flow has been on the nag device and is doing well  Medications: Reviewed on Rounds  Physical Exam:  Vitals: Temperature 96.7 pulse 79 respiratory rate 21 blood pressure 118/85 saturations 100%  Ventilator Settings off the ventilator right now on 3 L  . General: Comfortable at this time . Eyes: Grossly normal lids, irises & conjunctiva . ENT: grossly tongue is normal . Neck: no obvious mass . Cardiovascular: S1 S2 normal no gallop . Respiratory: No rhonchi no rales are noted at this time . Abdomen: soft . Skin: no rash seen on limited exam . Musculoskeletal: not rigid . Psychiatric:unable to assess . Neurologic: no seizure no involuntary movements         Lab Data:   Basic Metabolic Panel: Recent Labs  Lab 08/02/19 1457 08/03/19 0421  NA 142 143  K 3.5 3.8  CL 99 99  CO2 31 31  GLUCOSE 152* 172*  BUN 36* 35*  CREATININE 1.15 1.13  CALCIUM 8.8* 8.8*    ABG: No results for input(s): PHART, PCO2ART, PO2ART, HCO3, O2SAT in the last 168 hours.  Liver Function Tests: No results for input(s): AST, ALT, ALKPHOS, BILITOT, PROT, ALBUMIN in the last 168 hours. No results for input(s): LIPASE, AMYLASE in the last 168 hours. No results for input(s): AMMONIA in the last 168 hours.  CBC: Recent Labs  Lab 08/03/19 0421  WBC 10.6*  HGB 9.1*  HCT 29.1*  MCV 96.0  PLT 321    Cardiac Enzymes: No results for input(s): CKTOTAL, CKMB, CKMBINDEX, TROPONINI in the last 168 hours.  BNP (last 3 results) No results for input(s): BNP in the last 8760 hours.  ProBNP (last 3 results) No  results for input(s): PROBNP in the last 8760 hours.  Radiological Exams: No results found.  Assessment/Plan Active Problems:   Acute on chronic respiratory failure with hypoxemia (HCC)   COVID-19 virus infection   Pneumonia due to COVID-19 virus   Chronic atrial fibrillation (HCC)   Acute renal failure (ARF) ([place])   1. Acute on chronic respiratory failure with hypoxia patient is on the mag device still has a cuffed trach which will be changed today to a #6 cuffless trach and then will try to begin capping trials 2. COVID-19 infection treated improving 3. Pneumonia due to COVID-19 continue with supportive care clinically is improving 4. Chronic atrial fibrillation rate controlled 5. Acute renal failure patient is at baseline we will continue with supportive care   I have personally seen and evaluated the patient, evaluated laboratory and imaging results, formulated the assessment and plan and placed orders. The Patient requires high complexity decision making for assessment and support.  Case was discussed on Rounds with the Respiratory Therapy Staff  Allyne Gee, MD [facility] Pulmonary Critical Care Medicine Sleep Medicine

## 2019-08-09 DIAGNOSIS — I482 Chronic atrial fibrillation, unspecified: Secondary | ICD-10-CM | POA: Diagnosis not present

## 2019-08-09 DIAGNOSIS — U071 COVID-19: Secondary | ICD-10-CM | POA: Diagnosis not present

## 2019-08-09 DIAGNOSIS — N179 Acute kidney failure, unspecified: Secondary | ICD-10-CM | POA: Diagnosis not present

## 2019-08-09 DIAGNOSIS — J9621 Acute and chronic respiratory failure with hypoxia: Secondary | ICD-10-CM | POA: Diagnosis not present

## 2019-08-09 LAB — CBC
HCT: 32.6 % — ABNORMAL LOW (ref 39.0–52.0)
Hemoglobin: 10.3 g/dL — ABNORMAL LOW (ref 13.0–17.0)
MCH: 30 pg (ref 26.0–34.0)
MCHC: 31.6 g/dL (ref 30.0–36.0)
MCV: 95 fL (ref 80.0–100.0)
Platelets: 444 10*3/uL — ABNORMAL HIGH (ref 150–400)
RBC: 3.43 MIL/uL — ABNORMAL LOW (ref 4.22–5.81)
RDW: 15.8 % — ABNORMAL HIGH (ref 11.5–15.5)
WBC: 16.3 10*3/uL — ABNORMAL HIGH (ref 4.0–10.5)
nRBC: 0.2 % (ref 0.0–0.2)

## 2019-08-09 LAB — BASIC METABOLIC PANEL
Anion gap: 11 (ref 5–15)
BUN: 38 mg/dL — ABNORMAL HIGH (ref 8–23)
CO2: 32 mmol/L (ref 22–32)
Calcium: 8.6 mg/dL — ABNORMAL LOW (ref 8.9–10.3)
Chloride: 97 mmol/L — ABNORMAL LOW (ref 98–111)
Creatinine, Ser: 0.87 mg/dL (ref 0.61–1.24)
GFR calc Af Amer: >60 mL/min (ref >60)
GFR calc non Af Amer: >60 mL/min (ref >60)
Glucose, Bld: 128 mg/dL — ABNORMAL HIGH (ref 70–99)
Potassium: 3.9 mmol/L (ref 3.5–5.1)
Sodium: 140 mmol/L (ref 135–145)

## 2019-08-09 NOTE — Progress Notes (Signed)
Pulmonary Critical Care Medicine [place]   PULMONARY CRITICAL CARE SERVICE  PROGRESS NOTE  Date of Service: 08/09/2019  [name]  ZOX:[id number]  DOB: 26-Jan-1951   DOA: 07/28/2019  Referring Physician: Merton Border, MD  HPI: Kevin Middleton is a 68 y.o. male seen for follow up of Acute on Chronic Respiratory Failure.  Patient is doing well with capping right now is on 2 L  Medications: Reviewed on Rounds  Physical Exam:  Vitals: Temperature 97.0 pulse 84 respiratory 27 blood pressure 148/90 saturations 97%  Ventilator Settings capping off the ventilator  . General: Comfortable at this time . Eyes: Grossly normal lids, irises & conjunctiva . ENT: grossly tongue is normal . Neck: no obvious mass . Cardiovascular: S1 S2 normal no gallop . Respiratory: No rhonchi coarse breath sounds . Abdomen: soft . Skin: no rash seen on limited exam . Musculoskeletal: not rigid . Psychiatric:unable to assess . Neurologic: no seizure no involuntary movements         Lab Data:   Basic Metabolic Panel: Recent Labs  Lab 08/02/19 1457 08/03/19 0421 08/09/19 0601  NA 142 143 140  K 3.5 3.8 3.9  CL 99 99 97*  CO2 31 31 32  GLUCOSE 152* 172* 128*  BUN 36* 35* 38*  CREATININE 1.15 1.13 0.87  CALCIUM 8.8* 8.8* 8.6*    ABG: No results for input(s): PHART, PCO2ART, PO2ART, HCO3, O2SAT in the last 168 hours.  Liver Function Tests: No results for input(s): AST, ALT, ALKPHOS, BILITOT, PROT, ALBUMIN in the last 168 hours. No results for input(s): LIPASE, AMYLASE in the last 168 hours. No results for input(s): AMMONIA in the last 168 hours.  CBC: Recent Labs  Lab 08/03/19 0421 08/09/19 0601  WBC 10.6* 16.3*  HGB 9.1* 10.3*  HCT 29.1* 32.6*  MCV 96.0 95.0  PLT 321 444*    Cardiac Enzymes: No results for input(s): CKTOTAL, CKMB, CKMBINDEX, TROPONINI in the last 168 hours.  BNP (last 3 results) No results for input(s): BNP in the last 8760  hours.  ProBNP (last 3 results) No results for input(s): PROBNP in the last 8760 hours.  Radiological Exams: No results found.  Assessment/Plan Active Problems:   Acute on chronic respiratory failure with hypoxemia (HCC)   COVID-19 virus infection   Pneumonia due to COVID-19 virus   Chronic atrial fibrillation (HCC)   Acute renal failure (ARF) ([place])   1. Acute on chronic respiratory failure with hypoxia we will continue with capping trials patient is actually doing very well 2. COVID-19 virus infection treated we will continue with supportive care 3. Pneumonia due to COVID-19 resolution phase 4. Chronic atrial fibrillation rate controlled 5. Chronic renal failure continue to monitor labs has been improved   I have personally seen and evaluated the patient, evaluated laboratory and imaging results, formulated the assessment and plan and placed orders. The Patient requires high complexity decision making for assessment and support.  Case was discussed on Rounds with the Respiratory Therapy Staff  Allyne Gee, MD [facility] Pulmonary Critical Care Medicine Sleep Medicine

## 2019-08-10 ENCOUNTER — Other Ambulatory Visit (HOSPITAL_COMMUNITY): Payer: Medicare Other

## 2019-08-10 DIAGNOSIS — U071 COVID-19: Secondary | ICD-10-CM | POA: Diagnosis not present

## 2019-08-10 DIAGNOSIS — I482 Chronic atrial fibrillation, unspecified: Secondary | ICD-10-CM | POA: Diagnosis not present

## 2019-08-10 DIAGNOSIS — N179 Acute kidney failure, unspecified: Secondary | ICD-10-CM | POA: Diagnosis not present

## 2019-08-10 DIAGNOSIS — J9621 Acute and chronic respiratory failure with hypoxia: Secondary | ICD-10-CM | POA: Diagnosis not present

## 2019-08-10 NOTE — Progress Notes (Signed)
Pulmonary Critical Care Medicine [place]   PULMONARY CRITICAL CARE SERVICE  PROGRESS NOTE  Date of Service: 08/10/2019  Kevin Middleton  UXL:[id number]  DOB: 02-May-1951   DOA: 07/28/2019  Referring Physician: Merton Border, MD  HPI: Kevin Middleton is a 68 y.o. male seen for follow up of Acute on Chronic Respiratory Failure.  Patient has been capping more than 48 hours should be ready to decannulate  Medications: Reviewed on Rounds  Physical Exam:  Vitals: Temperature 96.9 pulse 81 respiratory 21 blood pressure 132/78 saturations 98%  Ventilator Settings capping off the ventilator right now  . General: Comfortable at this time . Eyes: Grossly normal lids, irises & conjunctiva . ENT: grossly tongue is normal . Neck: no obvious mass . Cardiovascular: S1 S2 normal no gallop . Respiratory: No rhonchi no rales are noted . Abdomen: soft . Skin: no rash seen on limited exam . Musculoskeletal: not rigid . Psychiatric:unable to assess . Neurologic: no seizure no involuntary movements         Lab Data:   Basic Metabolic Panel: Recent Labs  Lab 08/09/19 0601  NA 140  K 3.9  CL 97*  CO2 32  GLUCOSE 128*  BUN 38*  CREATININE 0.87  CALCIUM 8.6*    ABG: No results for input(s): PHART, PCO2ART, PO2ART, HCO3, O2SAT in the last 168 hours.  Liver Function Tests: No results for input(s): AST, ALT, ALKPHOS, BILITOT, PROT, ALBUMIN in the last 168 hours. No results for input(s): LIPASE, AMYLASE in the last 168 hours. No results for input(s): AMMONIA in the last 168 hours.  CBC: Recent Labs  Lab 08/09/19 0601  WBC 16.3*  HGB 10.3*  HCT 32.6*  MCV 95.0  PLT 444*    Cardiac Enzymes: No results for input(s): CKTOTAL, CKMB, CKMBINDEX, TROPONINI in the last 168 hours.  BNP (last 3 results) No results for input(s): BNP in the last 8760 hours.  ProBNP (last 3 results) No results for input(s): PROBNP in the last 8760 hours.  Radiological  Exams: Dg Chest Port 1 View  Result Date: 08/10/2019 CLINICAL DATA:  Pneumonia EXAM: PORTABLE CHEST 1 VIEW COMPARISON:  Radiograph 08/02/2019 FINDINGS: Tracheostomy tube terminates in the mid to upper trachea. Persistent multifocal areas of airspace disease, overall increased from comparison radiography 08/02/2019 most notably in the left upper lung and left lung base. Suspect bilateral pleural effusions. No pneumothorax. Cardiomediastinal contours are unchanged. Degenerative changes are present in the imaged spine and shoulders. No acute osseous or soft tissue abnormality. IMPRESSION: 1. Persistent multifocal areas of airspace disease, overall increased from comparison radiography 08/02/2019, most notably in the left upper lung and left lung base. 2. Suspect bilateral pleural effusions. Electronically Signed   By: Lovena Le M.D.   On: 08/10/2019 06:53    Assessment/Plan Active Problems:   Acute on chronic respiratory failure with hypoxemia (HCC)   COVID-19 virus infection   Pneumonia due to COVID-19 virus   Chronic atrial fibrillation (HCC)   Acute renal failure (ARF) ([place])   1. Acute on chronic respiratory failure hypoxia continue with capping proceed to decannulation may be in the next day as there is still some fluid overload noted on the last chest film 2. COVID-19 virus infection resolved 3. Pneumonia due to COVID-19 resolving 4. Chronic atrial fibrillation rate controlled 5. Acute renal failure we will continue to monitor labs   I have personally seen and evaluated the patient, evaluated laboratory and imaging results, formulated the assessment and plan and placed  orders. The Patient requires high complexity decision making for assessment and support.  Case was discussed on Rounds with the Respiratory Therapy Staff  Allyne Gee, MD [facility] Pulmonary Critical Care Medicine Sleep Medicine

## 2019-08-11 DIAGNOSIS — J9621 Acute and chronic respiratory failure with hypoxia: Secondary | ICD-10-CM | POA: Diagnosis not present

## 2019-08-11 DIAGNOSIS — N179 Acute kidney failure, unspecified: Secondary | ICD-10-CM | POA: Diagnosis not present

## 2019-08-11 DIAGNOSIS — U071 COVID-19: Secondary | ICD-10-CM | POA: Diagnosis not present

## 2019-08-11 DIAGNOSIS — I482 Chronic atrial fibrillation, unspecified: Secondary | ICD-10-CM | POA: Diagnosis not present

## 2019-08-11 NOTE — Progress Notes (Signed)
Pulmonary Critical Care Medicine [place]   PULMONARY CRITICAL CARE SERVICE  PROGRESS NOTE  Date of Service: 08/11/2019  CURLEY HOGEN  XTG:[id number]  DOB: 1950/11/14   DOA: 07/28/2019  Referring Physician: Merton Border, MD  HPI: Kevin Middleton is a 68 y.o. male seen for follow up of Acute on Chronic Respiratory Failure.  Patient is capping noted to have some thick secretions and also speech therapy said that had some difficulty swallowing also  Medications: Reviewed on Rounds  Physical Exam:  Vitals: Temperature 97.1 pulse 88 respiratory rate 20 blood pressures 188/94 saturations 95%  Ventilator Settings capping off the ventilator  . General: Comfortable at this time . Eyes: Grossly normal lids, irises & conjunctiva . ENT: grossly tongue is normal . Neck: no obvious mass . Cardiovascular: S1 S2 normal no gallop . Respiratory: No rhonchi no rales are noted at this time . Abdomen: soft . Skin: no rash seen on limited exam . Musculoskeletal: not rigid . Psychiatric:unable to assess . Neurologic: no seizure no involuntary movements         Lab Data:   Basic Metabolic Panel: Recent Labs  Lab 08/09/19 0601  NA 140  K 3.9  CL 97*  CO2 32  GLUCOSE 128*  BUN 38*  CREATININE 0.87  CALCIUM 8.6*    ABG: No results for input(s): PHART, PCO2ART, PO2ART, HCO3, O2SAT in the last 168 hours.  Liver Function Tests: No results for input(s): AST, ALT, ALKPHOS, BILITOT, PROT, ALBUMIN in the last 168 hours. No results for input(s): LIPASE, AMYLASE in the last 168 hours. No results for input(s): AMMONIA in the last 168 hours.  CBC: Recent Labs  Lab 08/09/19 0601  WBC 16.3*  HGB 10.3*  HCT 32.6*  MCV 95.0  PLT 444*    Cardiac Enzymes: No results for input(s): CKTOTAL, CKMB, CKMBINDEX, TROPONINI in the last 168 hours.  BNP (last 3 results) No results for input(s): BNP in the last 8760 hours.  ProBNP (last 3 results) No results for  input(s): PROBNP in the last 8760 hours.  Radiological Exams: Dg Chest Port 1 View  Result Date: 08/10/2019 CLINICAL DATA:  Pneumonia EXAM: PORTABLE CHEST 1 VIEW COMPARISON:  Radiograph 08/02/2019 FINDINGS: Tracheostomy tube terminates in the mid to upper trachea. Persistent multifocal areas of airspace disease, overall increased from comparison radiography 08/02/2019 most notably in the left upper lung and left lung base. Suspect bilateral pleural effusions. No pneumothorax. Cardiomediastinal contours are unchanged. Degenerative changes are present in the imaged spine and shoulders. No acute osseous or soft tissue abnormality. IMPRESSION: 1. Persistent multifocal areas of airspace disease, overall increased from comparison radiography 08/02/2019, most notably in the left upper lung and left lung base. 2. Suspect bilateral pleural effusions. Electronically Signed   By: Lovena Le M.D.   On: 08/10/2019 06:53    Assessment/Plan Active Problems:   Acute on chronic respiratory failure with hypoxemia (HCC)   COVID-19 virus infection   Pneumonia due to COVID-19 virus   Chronic atrial fibrillation (HCC)   Acute renal failure (ARF) ([place])   1. Acute on chronic respiratory failure with hypoxia patient right now is capping requiring suctioning.  My concern is that he also appears to not be totally oriented this may be residual neurological deficits 2. COVID-19 virus infection in resolution phase 3. Pneumonia due to COVID-19 slowly improving 4. Chronic atrial fibrillation rate controlled 5. Acute renal failure resolved   I have personally seen and evaluated the patient, evaluated laboratory and  imaging results, formulated the assessment and plan and placed orders. The Patient requires high complexity decision making for assessment and support.  Case was discussed on Rounds with the Respiratory Therapy Staff  Allyne Gee, MD [facility] Pulmonary Critical Care Medicine Sleep Medicine

## 2019-08-12 DIAGNOSIS — U071 COVID-19: Secondary | ICD-10-CM | POA: Diagnosis not present

## 2019-08-12 DIAGNOSIS — J9621 Acute and chronic respiratory failure with hypoxia: Secondary | ICD-10-CM | POA: Diagnosis not present

## 2019-08-12 DIAGNOSIS — N179 Acute kidney failure, unspecified: Secondary | ICD-10-CM | POA: Diagnosis not present

## 2019-08-12 DIAGNOSIS — I482 Chronic atrial fibrillation, unspecified: Secondary | ICD-10-CM | POA: Diagnosis not present

## 2019-08-12 LAB — CULTURE, RESPIRATORY W GRAM STAIN

## 2019-08-12 LAB — URINALYSIS, ROUTINE W REFLEX MICROSCOPIC
Bacteria, UA: NONE SEEN
Bilirubin Urine: NEGATIVE
Glucose, UA: NEGATIVE mg/dL
Ketones, ur: NEGATIVE mg/dL
Nitrite: NEGATIVE
Protein, ur: NEGATIVE mg/dL
RBC / HPF: >50 RBC/hpf — ABNORMAL HIGH (ref 0–5)
Specific Gravity, Urine: 1.02 (ref 1.005–1.030)
pH: 7 (ref 5.0–8.0)

## 2019-08-12 LAB — BASIC METABOLIC PANEL
Anion gap: 14 (ref 5–15)
BUN: 36 mg/dL — ABNORMAL HIGH (ref 8–23)
CO2: 30 mmol/L (ref 22–32)
Calcium: 8.9 mg/dL (ref 8.9–10.3)
Chloride: 96 mmol/L — ABNORMAL LOW (ref 98–111)
Creatinine, Ser: 0.92 mg/dL (ref 0.61–1.24)
GFR calc Af Amer: >60 mL/min (ref >60)
GFR calc non Af Amer: >60 mL/min (ref >60)
Glucose, Bld: 152 mg/dL — ABNORMAL HIGH (ref 70–99)
Potassium: 4.1 mmol/L (ref 3.5–5.1)
Sodium: 140 mmol/L (ref 135–145)

## 2019-08-12 LAB — CBC
HCT: 34.5 % — ABNORMAL LOW (ref 39.0–52.0)
Hemoglobin: 11.1 g/dL — ABNORMAL LOW (ref 13.0–17.0)
MCH: 30.5 pg (ref 26.0–34.0)
MCHC: 32.2 g/dL (ref 30.0–36.0)
MCV: 94.8 fL (ref 80.0–100.0)
Platelets: 397 10*3/uL (ref 150–400)
RBC: 3.64 MIL/uL — ABNORMAL LOW (ref 4.22–5.81)
RDW: 17.2 % — ABNORMAL HIGH (ref 11.5–15.5)
WBC: 19.6 10*3/uL — ABNORMAL HIGH (ref 4.0–10.5)
nRBC: 0.1 % (ref 0.0–0.2)

## 2019-08-12 NOTE — Progress Notes (Signed)
Pulmonary Critical Care Medicine [place]   PULMONARY CRITICAL CARE SERVICE  PROGRESS NOTE  Date of Service: 08/12/2019  Kevin Middleton  [id number]  DOB: August 21, 1951   DOA: 07/28/2019  Referring Physician: Merton Border, MD  HPI: Kevin Middleton is a 68 y.o. male seen for follow up of Acute on Chronic Respiratory Failure.  Patient currently is on the mag device seems to be tolerating it well  Medications: Reviewed on Rounds  Physical Exam:  Vitals: Temperature 97.0 pulse 103 respiratory 27 blood pressure 160/96 saturations 97%  Ventilator Settings currently off the ventilator  . General: Comfortable at this time . Eyes: Grossly normal lids, irises & conjunctiva . ENT: grossly tongue is normal . Neck: no obvious mass . Cardiovascular: S1 S2 normal no gallop . Respiratory: No rhonchi coarse breath sounds . Abdomen: soft . Skin: no rash seen on limited exam . Musculoskeletal: not rigid . Psychiatric:unable to assess . Neurologic: no seizure no involuntary movements         Lab Data:   Basic Metabolic Panel: Recent Labs  Lab 08/09/19 0601 08/12/19 0444  NA 140 140  K 3.9 4.1  CL 97* 96*  CO2 32 30  GLUCOSE 128* 152*  BUN 38* 36*  CREATININE 0.87 0.92  CALCIUM 8.6* 8.9    ABG: No results for input(s): PHART, PCO2ART, PO2ART, HCO3, O2SAT in the last 168 hours.  Liver Function Tests: No results for input(s): AST, ALT, ALKPHOS, BILITOT, PROT, ALBUMIN in the last 168 hours. No results for input(s): LIPASE, AMYLASE in the last 168 hours. No results for input(s): AMMONIA in the last 168 hours.  CBC: Recent Labs  Lab 08/09/19 0601 08/12/19 0444  WBC 16.3* 19.6*  HGB 10.3* 11.1*  HCT 32.6* 34.5*  MCV 95.0 94.8  PLT 444* 397    Cardiac Enzymes: No results for input(s): CKTOTAL, CKMB, CKMBINDEX, TROPONINI in the last 168 hours.  BNP (last 3 results) No results for input(s): BNP in the last 8760 hours.  ProBNP (last 3  results) No results for input(s): PROBNP in the last 8760 hours.  Radiological Exams: No results found.  Assessment/Plan Active Problems:   Acute on chronic respiratory failure with hypoxemia (HCC)   COVID-19 virus infection   Pneumonia due to COVID-19 virus   Chronic atrial fibrillation (HCC)   Acute renal failure (ARF) ([place])   1. Acute on chronic respiratory failure with hypoxia plan is to continue with weaning off the ventilator I would like to try to start capping once again 2. COVID-19 virus infection treated we will continue with supportive care 3. Pneumonia due to COVID-19 treated 4. Chronic atrial fibrillation rate is controlled 5. Acute renal failure we will continue to monitor labs closely   I have personally seen and evaluated the patient, evaluated laboratory and imaging results, formulated the assessment and plan and placed orders. The Patient requires high complexity decision making for assessment and support.  Case was discussed on Rounds with the Respiratory Therapy Staff  Allyne Gee, MD [facility] Pulmonary Critical Care Medicine Sleep Medicine

## 2019-08-13 ENCOUNTER — Other Ambulatory Visit (HOSPITAL_COMMUNITY): Payer: Medicare Other

## 2019-08-13 DIAGNOSIS — N179 Acute kidney failure, unspecified: Secondary | ICD-10-CM | POA: Diagnosis not present

## 2019-08-13 DIAGNOSIS — I482 Chronic atrial fibrillation, unspecified: Secondary | ICD-10-CM | POA: Diagnosis not present

## 2019-08-13 DIAGNOSIS — J9621 Acute and chronic respiratory failure with hypoxia: Secondary | ICD-10-CM | POA: Diagnosis not present

## 2019-08-13 DIAGNOSIS — U071 COVID-19: Secondary | ICD-10-CM | POA: Diagnosis not present

## 2019-08-13 LAB — URINE CULTURE: Culture: NO GROWTH

## 2019-08-13 NOTE — Progress Notes (Signed)
Pulmonary Critical Care Medicine [place]   PULMONARY CRITICAL CARE SERVICE  PROGRESS NOTE  Date of Service: 08/13/2019  DONNOVAN STAMOUR  WCB:[id number]  DOB: [birth year]   DOA: 07/28/2019  Referring Physician: Merton Border, MD  HPI: REG BIRCHER is a 68 y.o. male seen for follow up of Acute on Chronic Respiratory Failure.  Patient is currently on the nag device has been on 1 L oxygen plan is to try to do capping trials  Medications: Reviewed on Rounds  Physical Exam:  Vitals: Temperature is 98.0 pulse 93 respiratory 21 blood pressure 149/94 saturations  Ventilator Settings off the ventilator right now on nag device  . General: Comfortable at this time . Eyes: Grossly normal lids, irises & conjunctiva . ENT: grossly tongue is normal . Neck: no obvious mass . Cardiovascular: S1 S2 normal no gallop . Respiratory: No rhonchi no rales are noted at this time . Abdomen: soft . Skin: no rash seen on limited exam . Musculoskeletal: not rigid . Psychiatric:unable to assess . Neurologic: no seizure no involuntary movements         Lab Data:   Basic Metabolic Panel: Recent Labs  Lab 08/09/19 0601 08/12/19 0444  NA 140 140  K 3.9 4.1  CL 97* 96*  CO2 32 30  GLUCOSE 128* 152*  BUN 38* 36*  CREATININE 0.87 0.92  CALCIUM 8.6* 8.9    ABG: No results for input(s): PHART, PCO2ART, PO2ART, HCO3, O2SAT in the last 168 hours.  Liver Function Tests: No results for input(s): AST, ALT, ALKPHOS, BILITOT, PROT, ALBUMIN in the last 168 hours. No results for input(s): LIPASE, AMYLASE in the last 168 hours. No results for input(s): AMMONIA in the last 168 hours.  CBC: Recent Labs  Lab 08/09/19 0601 08/12/19 0444  WBC 16.3* 19.6*  HGB 10.3* 11.1*  HCT 32.6* 34.5*  MCV 95.0 94.8  PLT 444* 397    Cardiac Enzymes: No results for input(s): CKTOTAL, CKMB, CKMBINDEX, TROPONINI in the last 168 hours.  BNP (last 3 results) No results for input(s): BNP  in the last 8760 hours.  ProBNP (last 3 results) No results for input(s): PROBNP in the last 8760 hours.  Radiological Exams: Dg Chest Port 1 View  Result Date: 08/13/2019 CLINICAL DATA:  Respiratory failure. EXAM: PORTABLE CHEST 1 VIEW COMPARISON:  08/10/2019 FINDINGS: The patient is rotated to the right limiting assessment of the mediastinal structures. Tracheostomy tube overlies the airway. There is mild elevation of the right hemidiaphragm. Patchy right upper lobe and right basilar airspace opacities have not significantly changed. Left lung aeration has improved with persistent opacities most notable in the base. No definite pleural effusion or pneumothorax is identified. IMPRESSION: Bilateral airspace disease, improved on the left and unchanged on the right. Electronically Signed   By: Logan Bores M.D.   On: 08/13/2019 06:13    Assessment/Plan Active Problems:   Acute on chronic respiratory failure with hypoxemia (HCC)   COVID-19 virus infection   Pneumonia due to COVID-19 virus   Chronic atrial fibrillation (HCC)   Acute renal failure (ARF) ([place])   1. Acute on chronic respiratory failure hypoxia patient will be continued on nag device 1 L oxygen.  Spoke with respiratory therapy to also relook at capping 2. COVID-19 virus infection treated we will continue with supportive care 3. Pneumonia due to COVID-19 clinically improving 4. Chronic atrial fibrillation rate controlled at this time 5. Acute renal failure follow-up labs   I have personally seen and  evaluated the patient, evaluated laboratory and imaging results, formulated the assessment and plan and placed orders. The Patient requires high complexity decision making for assessment and support.  Case was discussed on Rounds with the Respiratory Therapy Staff  Allyne Gee, MD [facility] Pulmonary Critical Care Medicine Sleep Medicine

## 2019-08-14 DIAGNOSIS — J9621 Acute and chronic respiratory failure with hypoxia: Secondary | ICD-10-CM | POA: Diagnosis not present

## 2019-08-14 DIAGNOSIS — N179 Acute kidney failure, unspecified: Secondary | ICD-10-CM | POA: Diagnosis not present

## 2019-08-14 DIAGNOSIS — U071 COVID-19: Secondary | ICD-10-CM | POA: Diagnosis not present

## 2019-08-14 DIAGNOSIS — I482 Chronic atrial fibrillation, unspecified: Secondary | ICD-10-CM | POA: Diagnosis not present

## 2019-08-14 NOTE — Progress Notes (Signed)
Pulmonary Critical Care Medicine [place]   PULMONARY CRITICAL CARE SERVICE  PROGRESS NOTE  Date of Service: 08/14/2019  Kevin Middleton  XIP:[id number]  DOB: 1951-06-02   DOA: 07/28/2019  Referring Physician: Merton Border, MD  HPI: Kevin Middleton is a 69 y.o. male seen for follow up of Acute on Chronic Respiratory Failure.  Remains off the ventilator currently is on 1 L oxygen secretions are still thick  Medications: Reviewed on Rounds  Physical Exam:  Vitals: Temperature 97.7 pulse 108 respiratory rate 30 blood pressure 142/91 saturations 97%  Ventilator Settings on the mag device 1 L oxygen  . General: Comfortable at this time . Eyes: Grossly normal lids, irises & conjunctiva . ENT: grossly tongue is normal . Neck: no obvious mass . Cardiovascular: S1 S2 normal no gallop . Respiratory: No rhonchi coarse breath sounds are noted . Abdomen: soft . Skin: no rash seen on limited exam . Musculoskeletal: not rigid . Psychiatric:unable to assess . Neurologic: no seizure no involuntary movements         Lab Data:   Basic Metabolic Panel: Recent Labs  Lab 08/09/19 0601 08/12/19 0444  NA 140 140  K 3.9 4.1  CL 97* 96*  CO2 32 30  GLUCOSE 128* 152*  BUN 38* 36*  CREATININE 0.87 0.92  CALCIUM 8.6* 8.9    ABG: No results for input(s): PHART, PCO2ART, PO2ART, HCO3, O2SAT in the last 168 hours.  Liver Function Tests: No results for input(s): AST, ALT, ALKPHOS, BILITOT, PROT, ALBUMIN in the last 168 hours. No results for input(s): LIPASE, AMYLASE in the last 168 hours. No results for input(s): AMMONIA in the last 168 hours.  CBC: Recent Labs  Lab 08/09/19 0601 08/12/19 0444  WBC 16.3* 19.6*  HGB 10.3* 11.1*  HCT 32.6* 34.5*  MCV 95.0 94.8  PLT 444* 397    Cardiac Enzymes: No results for input(s): CKTOTAL, CKMB, CKMBINDEX, TROPONINI in the last 168 hours.  BNP (last 3 results) No results for input(s): BNP in the last 8760  hours.  ProBNP (last 3 results) No results for input(s): PROBNP in the last 8760 hours.  Radiological Exams: Dg Chest Port 1 View  Result Date: 08/13/2019 CLINICAL DATA:  Respiratory failure. EXAM: PORTABLE CHEST 1 VIEW COMPARISON:  08/10/2019 FINDINGS: The patient is rotated to the right limiting assessment of the mediastinal structures. Tracheostomy tube overlies the airway. There is mild elevation of the right hemidiaphragm. Patchy right upper lobe and right basilar airspace opacities have not significantly changed. Left lung aeration has improved with persistent opacities most notable in the base. No definite pleural effusion or pneumothorax is identified. IMPRESSION: Bilateral airspace disease, improved on the left and unchanged on the right. Electronically Signed   By: Logan Bores M.D.   On: 08/13/2019 06:13    Assessment/Plan Active Problems:   Acute on chronic respiratory failure with hypoxemia (HCC)   COVID-19 virus infection   Pneumonia due to COVID-19 virus   Chronic atrial fibrillation (HCC)   Acute renal failure (ARF) ([place])   1. Acute on chronic respiratory failure hypoxia continues on nag patient right now is on 1 L oxygen 2. COVID-19 virus infection in resolution phase 3. Pneumonia due to COVID-19 treated 4. Chronic atrial fibrillation rate is controlled we will continue with supportive care 5. Acute renal failure follow labs   I have personally seen and evaluated the patient, evaluated laboratory and imaging results, formulated the assessment and plan and placed orders. The Patient requires  high complexity decision making for assessment and support.  Case was discussed on Rounds with the Respiratory Therapy Staff  Allyne Gee, MD [facility] Pulmonary Critical Care Medicine Sleep Medicine

## 2019-08-15 DIAGNOSIS — I482 Chronic atrial fibrillation, unspecified: Secondary | ICD-10-CM | POA: Diagnosis not present

## 2019-08-15 DIAGNOSIS — U071 COVID-19: Secondary | ICD-10-CM | POA: Diagnosis not present

## 2019-08-15 DIAGNOSIS — N179 Acute kidney failure, unspecified: Secondary | ICD-10-CM | POA: Diagnosis not present

## 2019-08-15 DIAGNOSIS — J9621 Acute and chronic respiratory failure with hypoxia: Secondary | ICD-10-CM | POA: Diagnosis not present

## 2019-08-15 NOTE — Progress Notes (Signed)
Pulmonary Critical Care Medicine [place]   PULMONARY CRITICAL CARE SERVICE  PROGRESS NOTE  Date of Service: 08/15/2019  GWYNN CROSSLEY  WUJ:[id number]  DOB: 1950-12-23   DOA: 07/28/2019  Referring Physician: Merton Border, MD  HPI: Kevin Middleton is a 68 y.o. male seen for follow up of Acute on Chronic Respiratory Failure.  Patient is on nag requiring 1 L oxygen  Medications: Reviewed on Rounds  Physical Exam:  Vitals: Temperature is 97.1 pulse 86 respiratory rate 15 blood pressure 118/74 saturations 100%  Ventilator Settings currently on the nag 1 L oxygen  . General: Comfortable at this time . Eyes: Grossly normal lids, irises & conjunctiva . ENT: grossly tongue is normal . Neck: no obvious mass . Cardiovascular: S1 S2 normal no gallop . Respiratory: No rhonchi no rales are noted at this time . Abdomen: soft . Skin: no rash seen on limited exam . Musculoskeletal: not rigid . Psychiatric:unable to assess . Neurologic: no seizure no involuntary movements         Lab Data:   Basic Metabolic Panel: Recent Labs  Lab 08/09/19 0601 08/12/19 0444  NA 140 140  K 3.9 4.1  CL 97* 96*  CO2 32 30  GLUCOSE 128* 152*  BUN 38* 36*  CREATININE 0.87 0.92  CALCIUM 8.6* 8.9    ABG: No results for input(s): PHART, PCO2ART, PO2ART, HCO3, O2SAT in the last 168 hours.  Liver Function Tests: No results for input(s): AST, ALT, ALKPHOS, BILITOT, PROT, ALBUMIN in the last 168 hours. No results for input(s): LIPASE, AMYLASE in the last 168 hours. No results for input(s): AMMONIA in the last 168 hours.  CBC: Recent Labs  Lab 08/09/19 0601 08/12/19 0444  WBC 16.3* 19.6*  HGB 10.3* 11.1*  HCT 32.6* 34.5*  MCV 95.0 94.8  PLT 444* 397    Cardiac Enzymes: No results for input(s): CKTOTAL, CKMB, CKMBINDEX, TROPONINI in the last 168 hours.  BNP (last 3 results) No results for input(s): BNP in the last 8760 hours.  ProBNP (last 3 results) No  results for input(s): PROBNP in the last 8760 hours.  Radiological Exams: No results found.  Assessment/Plan Active Problems:   Acute on chronic respiratory failure with hypoxemia (HCC)   COVID-19 virus infection   Pneumonia due to COVID-19 virus   Chronic atrial fibrillation (HCC)   Acute renal failure (ARF) ([place])   1. Acute on chronic respiratory failure hypoxia plan is to continue with the 1 L oxygen sputum cultures were apparently ordered and sent we will follow up 2. COVID-19 virus infection in resolution phase 3. Pneumonia due to COVID-19 treated slowly improving 4. Chronic atrial fibrillation rate controlled 5. Acute renal failure we will continue to monitor labs   I have personally seen and evaluated the patient, evaluated laboratory and imaging results, formulated the assessment and plan and placed orders. The Patient requires high complexity decision making for assessment and support.  Case was discussed on Rounds with the Respiratory Therapy Staff  Allyne Gee, MD [facility] Pulmonary Critical Care Medicine Sleep Medicine

## 2019-08-16 DIAGNOSIS — J9621 Acute and chronic respiratory failure with hypoxia: Secondary | ICD-10-CM | POA: Diagnosis not present

## 2019-08-16 DIAGNOSIS — I482 Chronic atrial fibrillation, unspecified: Secondary | ICD-10-CM | POA: Diagnosis not present

## 2019-08-16 DIAGNOSIS — U071 COVID-19: Secondary | ICD-10-CM | POA: Diagnosis not present

## 2019-08-16 DIAGNOSIS — N179 Acute kidney failure, unspecified: Secondary | ICD-10-CM | POA: Diagnosis not present

## 2019-08-16 LAB — CBC
HCT: 32.9 % — ABNORMAL LOW (ref 39.0–52.0)
Hemoglobin: 10.2 g/dL — ABNORMAL LOW (ref 13.0–17.0)
MCH: 30.4 pg (ref 26.0–34.0)
MCHC: 31 g/dL (ref 30.0–36.0)
MCV: 97.9 fL (ref 80.0–100.0)
Platelets: 329 10*3/uL (ref 150–400)
RBC: 3.36 MIL/uL — ABNORMAL LOW (ref 4.22–5.81)
RDW: 17.2 % — ABNORMAL HIGH (ref 11.5–15.5)
WBC: 11.5 10*3/uL — ABNORMAL HIGH (ref 4.0–10.5)
nRBC: 0 % (ref 0.0–0.2)

## 2019-08-16 LAB — BASIC METABOLIC PANEL
Anion gap: 12 (ref 5–15)
BUN: 34 mg/dL — ABNORMAL HIGH (ref 8–23)
CO2: 33 mmol/L — ABNORMAL HIGH (ref 22–32)
Calcium: 8.6 mg/dL — ABNORMAL LOW (ref 8.9–10.3)
Chloride: 100 mmol/L (ref 98–111)
Creatinine, Ser: 0.73 mg/dL (ref 0.61–1.24)
GFR calc Af Amer: >60 mL/min (ref >60)
GFR calc non Af Amer: >60 mL/min (ref >60)
Glucose, Bld: 154 mg/dL — ABNORMAL HIGH (ref 70–99)
Potassium: 4 mmol/L (ref 3.5–5.1)
Sodium: 145 mmol/L (ref 135–145)

## 2019-08-16 NOTE — Progress Notes (Signed)
Pulmonary Critical Care Medicine [place]   PULMONARY CRITICAL CARE SERVICE  PROGRESS NOTE  Date of Service: 08/16/2019  Kevin Middleton  MGN:[id number]  DOB: Oct 07, 1950   DOA: 07/28/2019  Referring Physician: Merton Border, MD  HPI: Kevin Middleton is a 68 y.o. male seen for follow up of Acute on Chronic Respiratory Failure.  Patient continues on the nag device has been still having issues with thick tenacious secretions.  Requiring about 1 L oxygen  Medications: Reviewed on Rounds  Physical Exam:  Vitals: Temperature is 98.0 pulse 79 respiratory rate 30 blood pressure 124/80 saturations 100%  Ventilator Settings off the ventilator on 1 L  . General: Comfortable at this time . Eyes: Grossly normal lids, irises & conjunctiva . ENT: grossly tongue is normal . Neck: no obvious mass . Cardiovascular: S1 S2 normal no gallop . Respiratory: No rhonchi coarse breath sounds are noted . Abdomen: soft . Skin: no rash seen on limited exam . Musculoskeletal: not rigid . Psychiatric:unable to assess . Neurologic: no seizure no involuntary movements         Lab Data:   Basic Metabolic Panel: Recent Labs  Lab 08/12/19 0444 08/16/19 0642  NA 140 145  K 4.1 4.0  CL 96* 100  CO2 30 33*  GLUCOSE 152* 154*  BUN 36* 34*  CREATININE 0.92 0.73  CALCIUM 8.9 8.6*    ABG: No results for input(s): PHART, PCO2ART, PO2ART, HCO3, O2SAT in the last 168 hours.  Liver Function Tests: No results for input(s): AST, ALT, ALKPHOS, BILITOT, PROT, ALBUMIN in the last 168 hours. No results for input(s): LIPASE, AMYLASE in the last 168 hours. No results for input(s): AMMONIA in the last 168 hours.  CBC: Recent Labs  Lab 08/12/19 0444 08/16/19 0642  WBC 19.6* 11.5*  HGB 11.1* 10.2*  HCT 34.5* 32.9*  MCV 94.8 97.9  PLT 397 329    Cardiac Enzymes: No results for input(s): CKTOTAL, CKMB, CKMBINDEX, TROPONINI in the last 168 hours.  BNP (last 3 results) No  results for input(s): BNP in the last 8760 hours.  ProBNP (last 3 results) No results for input(s): PROBNP in the last 8760 hours.  Radiological Exams: No results found.  Assessment/Plan Active Problems:   Acute on chronic respiratory failure with hypoxemia (HCC)   COVID-19 virus infection   Pneumonia due to COVID-19 virus   Chronic atrial fibrillation (HCC)   Acute renal failure (ARF) ([place])   1. Acute on chronic respiratory failure with hypoxia patient continues on the nag device 1 L oxygen continue with aggressive pulmonary toilet supportive care 2. COVID-19 virus infection in resolution phase we will continue with supportive care 3. Pneumonia due to COVID-19 treated 4. Chronic atrial fibrillation rate controlled 5. Acute renal failure stabilized we will continue to monitor labs   I have personally seen and evaluated the patient, evaluated laboratory and imaging results, formulated the assessment and plan and placed orders. The Patient requires high complexity decision making for assessment and support.  Case was discussed on Rounds with the Respiratory Therapy Staff  Allyne Gee, MD [facility] Pulmonary Critical Care Medicine Sleep Medicine

## 2019-08-17 DIAGNOSIS — I482 Chronic atrial fibrillation, unspecified: Secondary | ICD-10-CM | POA: Diagnosis not present

## 2019-08-17 DIAGNOSIS — N179 Acute kidney failure, unspecified: Secondary | ICD-10-CM | POA: Diagnosis not present

## 2019-08-17 DIAGNOSIS — U071 COVID-19: Secondary | ICD-10-CM | POA: Diagnosis not present

## 2019-08-17 DIAGNOSIS — J9621 Acute and chronic respiratory failure with hypoxia: Secondary | ICD-10-CM | POA: Diagnosis not present

## 2019-08-17 LAB — CULTURE, RESPIRATORY W GRAM STAIN

## 2019-08-17 NOTE — Progress Notes (Signed)
Pulmonary Critical Care Medicine [place]   PULMONARY CRITICAL CARE SERVICE  PROGRESS NOTE  Date of Service: 08/17/2019  Kevin Middleton  NAT:[id number]  DOB: 10-01-1950   DOA: 07/28/2019  Referring Physician: Merton Border, MD  HPI: Kevin Middleton is a 68 y.o. male seen for follow up of Acute on Chronic Respiratory Failure.  Patient is on 1 L oxygen secretions are still significant last sputum culture was growing [place] we will discussed with the primary care team  Medications: Reviewed on Rounds  Physical Exam:  Vitals: Temperature 97.4 pulse 90 respiratory rate 22 blood pressure 141/91 saturations 100%  Ventilator Settings mode of ventilation is spontaneous off the vent on a nag device  . General: Comfortable at this time . Eyes: Grossly normal lids, irises & conjunctiva . ENT: grossly tongue is normal . Neck: no obvious mass . Cardiovascular: S1 S2 normal no gallop . Respiratory: No rhonchi coarse breath sounds are noted . Abdomen: soft . Skin: no rash seen on limited exam . Musculoskeletal: not rigid . Psychiatric:unable to assess . Neurologic: no seizure no involuntary movements         Lab Data:   Basic Metabolic Panel: Recent Labs  Lab 08/12/19 0444 08/16/19 0642  NA 140 145  K 4.1 4.0  CL 96* 100  CO2 30 33*  GLUCOSE 152* 154*  BUN 36* 34*  CREATININE 0.92 0.73  CALCIUM 8.9 8.6*    ABG: No results for input(s): PHART, PCO2ART, PO2ART, HCO3, O2SAT in the last 168 hours.  Liver Function Tests: No results for input(s): AST, ALT, ALKPHOS, BILITOT, PROT, ALBUMIN in the last 168 hours. No results for input(s): LIPASE, AMYLASE in the last 168 hours. No results for input(s): AMMONIA in the last 168 hours.  CBC: Recent Labs  Lab 08/12/19 0444 08/16/19 0642  WBC 19.6* 11.5*  HGB 11.1* 10.2*  HCT 34.5* 32.9*  MCV 94.8 97.9  PLT 397 329    Cardiac Enzymes: No results for input(s): CKTOTAL, CKMB, CKMBINDEX, TROPONINI in  the last 168 hours.  BNP (last 3 results) No results for input(s): BNP in the last 8760 hours.  ProBNP (last 3 results) No results for input(s): PROBNP in the last 8760 hours.  Radiological Exams: No results found.  Assessment/Plan Active Problems:   Acute on chronic respiratory failure with hypoxemia (HCC)   COVID-19 virus infection   Pneumonia due to COVID-19 virus   Chronic atrial fibrillation (HCC)   Acute renal failure (ARF) ([place])   1. Acute on chronic respiratory failure hypoxia patient is on 1 L oxygen with copious secretions being the limiting factor for advancing the wean further. 2. COVID-19 virus infection treated we will continue with supportive care 3. Pneumonia due to COVID-19 resolving however patient still has significant increase in secretions. 4. Chronic atrial fibrillation rate is controlled we will continue to follow along 5. Acute renal failure baseline we will continue with supportive care   I have personally seen and evaluated the patient, evaluated laboratory and imaging results, formulated the assessment and plan and placed orders. The Patient requires high complexity decision making for assessment and support.  Case was discussed on Rounds with the Respiratory Therapy Staff  Allyne Gee, MD [facility] Pulmonary Critical Care Medicine Sleep Medicine

## 2019-08-18 DIAGNOSIS — N179 Acute kidney failure, unspecified: Secondary | ICD-10-CM | POA: Diagnosis not present

## 2019-08-18 DIAGNOSIS — U071 COVID-19: Secondary | ICD-10-CM | POA: Diagnosis not present

## 2019-08-18 DIAGNOSIS — J9621 Acute and chronic respiratory failure with hypoxia: Secondary | ICD-10-CM | POA: Diagnosis not present

## 2019-08-18 DIAGNOSIS — I482 Chronic atrial fibrillation, unspecified: Secondary | ICD-10-CM | POA: Diagnosis not present

## 2019-08-18 LAB — VANCOMYCIN, TROUGH: Vancomycin Tr: 22 ug/mL (ref 15–20)

## 2019-08-18 NOTE — Progress Notes (Signed)
Pulmonary Critical Care Medicine [place]   PULMONARY CRITICAL CARE SERVICE  PROGRESS NOTE  Date of Service: 08/18/2019  PASCAL STIGGERS  XNA:[id number]  DOB: 22-Sep-1950   DOA: 07/28/2019  Referring Physician: Merton Border, MD  HPI: Kevin Middleton is a 68 y.o. male seen for follow up of Acute on Chronic Respiratory Failure.  Currently off the ventilator secretions are still reported as copious unfortunately  Medications: Reviewed on Rounds  Physical Exam:  Vitals: Temperature 96.7 pulse 104 respiratory 18 blood pressure 135/77 saturations 100%  Ventilator Settings off the ventilator on the nag device 1 L oxygen  . General: Comfortable at this time . Eyes: Grossly normal lids, irises & conjunctiva . ENT: grossly tongue is normal . Neck: no obvious mass . Cardiovascular: S1 S2 normal no gallop . Respiratory: No rhonchi at this time . Abdomen: soft . Skin: no rash seen on limited exam . Musculoskeletal: not rigid . Psychiatric:unable to assess . Neurologic: no seizure no involuntary movements         Lab Data:   Basic Metabolic Panel: Recent Labs  Lab 08/12/19 0444 08/16/19 0642  NA 140 145  K 4.1 4.0  CL 96* 100  CO2 30 33*  GLUCOSE 152* 154*  BUN 36* 34*  CREATININE 0.92 0.73  CALCIUM 8.9 8.6*    ABG: No results for input(s): PHART, PCO2ART, PO2ART, HCO3, O2SAT in the last 168 hours.  Liver Function Tests: No results for input(s): AST, ALT, ALKPHOS, BILITOT, PROT, ALBUMIN in the last 168 hours. No results for input(s): LIPASE, AMYLASE in the last 168 hours. No results for input(s): AMMONIA in the last 168 hours.  CBC: Recent Labs  Lab 08/12/19 0444 08/16/19 0642  WBC 19.6* 11.5*  HGB 11.1* 10.2*  HCT 34.5* 32.9*  MCV 94.8 97.9  PLT 397 329    Cardiac Enzymes: No results for input(s): CKTOTAL, CKMB, CKMBINDEX, TROPONINI in the last 168 hours.  BNP (last 3 results) No results for input(s): BNP in the last 8760  hours.  ProBNP (last 3 results) No results for input(s): PROBNP in the last 8760 hours.  Radiological Exams: No results found.  Assessment/Plan Active Problems:   Acute on chronic respiratory failure with hypoxemia (HCC)   COVID-19 virus infection   Pneumonia due to COVID-19 virus   Chronic atrial fibrillation (HCC)   Acute renal failure (ARF) ([place])   1. Acute on chronic respiratory failure with hypoxia plan is to continue with a nag at this time.  Titrate oxygen as necessary secretions are limiting [place] as far as being able to decannulate and also the fact that does not have a very strong effective cough 2. COVID-19 virus infection resolved 3. Pneumonia due to COVID-19 resolved residual secretions are still an issue 4. Chronic atrial fibrillation rate controlled 5. Acute renal failure we will continue with supportive care   I have personally seen and evaluated the patient, evaluated laboratory and imaging results, formulated the assessment and plan and placed orders. The Patient requires high complexity decision making for assessment and support.  Case was discussed on Rounds with the Respiratory Therapy Staff  Allyne Gee, MD [facility] Pulmonary Critical Care Medicine Sleep Medicine

## 2019-08-19 DIAGNOSIS — I482 Chronic atrial fibrillation, unspecified: Secondary | ICD-10-CM | POA: Diagnosis not present

## 2019-08-19 DIAGNOSIS — J9621 Acute and chronic respiratory failure with hypoxia: Secondary | ICD-10-CM | POA: Diagnosis not present

## 2019-08-19 DIAGNOSIS — N179 Acute kidney failure, unspecified: Secondary | ICD-10-CM | POA: Diagnosis not present

## 2019-08-19 DIAGNOSIS — U071 COVID-19: Secondary | ICD-10-CM | POA: Diagnosis not present

## 2019-08-19 NOTE — Progress Notes (Signed)
Pulmonary Critical Care Medicine [place]   PULMONARY CRITICAL CARE SERVICE  PROGRESS NOTE  Date of Service: 08/19/2019  Kevin Middleton  QJJ:[id number]  DOB: January 08, 1951   DOA: 07/28/2019  Referring Physician: Merton Border, MD  HPI: Kevin Middleton is a 68 y.o. male seen for follow up of Acute on Chronic Respiratory Failure.  Continues on nag now is on room air secretions are reportedly improving today  Medications: Reviewed on Rounds  Physical Exam:  Vitals: Temperature 96.5 pulse 106 respiratory rate 24 blood pressure 155/96 saturations 100%  Ventilator Settings off the ventilator spontaneous breathing  . General: Comfortable at this time . Eyes: Grossly normal lids, irises & conjunctiva . ENT: grossly tongue is normal . Neck: no obvious mass . Cardiovascular: S1 S2 normal no gallop . Respiratory: No rhonchi coarse breath sounds are noted . Abdomen: soft . Skin: no rash seen on limited exam . Musculoskeletal: not rigid . Psychiatric:unable to assess . Neurologic: no seizure no involuntary movements         Lab Data:   Basic Metabolic Panel: Recent Labs  Lab 08/16/19 0642  NA 145  K 4.0  CL 100  CO2 33*  GLUCOSE 154*  BUN 34*  CREATININE 0.73  CALCIUM 8.6*    ABG: No results for input(s): PHART, PCO2ART, PO2ART, HCO3, O2SAT in the last 168 hours.  Liver Function Tests: No results for input(s): AST, ALT, ALKPHOS, BILITOT, PROT, ALBUMIN in the last 168 hours. No results for input(s): LIPASE, AMYLASE in the last 168 hours. No results for input(s): AMMONIA in the last 168 hours.  CBC: Recent Labs  Lab 08/16/19 0642  WBC 11.5*  HGB 10.2*  HCT 32.9*  MCV 97.9  PLT 329    Cardiac Enzymes: No results for input(s): CKTOTAL, CKMB, CKMBINDEX, TROPONINI in the last 168 hours.  BNP (last 3 results) No results for input(s): BNP in the last 8760 hours.  ProBNP (last 3 results) No results for input(s): PROBNP in the last 8760  hours.  Radiological Exams: No results found.  Assessment/Plan Active Problems:   Acute on chronic respiratory failure with hypoxemia (HCC)   COVID-19 virus infection   Pneumonia due to COVID-19 virus   Chronic atrial fibrillation (HCC)   Acute renal failure (ARF) ([place])   1. Acute on chronic respiratory failure with hypoxia plan is to continue with mag device as the secretions improve we should hopefully be able to start capping 2. COVID-19 virus infection resolution phase 3. Pneumonia due to COVID-19 resolved with residual secretion issues 4. Chronic atrial fibrillation rate controlled 5. Acute renal failure we will continue with supportive care labs are improved   I have personally seen and evaluated the patient, evaluated laboratory and imaging results, formulated the assessment and plan and placed orders. The Patient requires high complexity decision making for assessment and support.  Case was discussed on Rounds with the Respiratory Therapy Staff  Allyne Gee, MD [facility] Pulmonary Critical Care Medicine Sleep Medicine

## 2019-08-20 DIAGNOSIS — J9621 Acute and chronic respiratory failure with hypoxia: Secondary | ICD-10-CM | POA: Diagnosis not present

## 2019-08-20 DIAGNOSIS — I482 Chronic atrial fibrillation, unspecified: Secondary | ICD-10-CM | POA: Diagnosis not present

## 2019-08-20 DIAGNOSIS — U071 COVID-19: Secondary | ICD-10-CM | POA: Diagnosis not present

## 2019-08-20 DIAGNOSIS — N179 Acute kidney failure, unspecified: Secondary | ICD-10-CM | POA: Diagnosis not present

## 2019-08-20 NOTE — Progress Notes (Signed)
Pulmonary Critical Care Medicine [place]   PULMONARY CRITICAL CARE SERVICE  PROGRESS NOTE  Date of Service: 08/20/2019  Kevin Middleton  [id number]:[id number]  DOB: 11/12/1950   DOA: 07/28/2019  Referring Physician: Merton Border, MD  HPI: Kevin Middleton is a 68 y.o. male seen for follow up of Acute on Chronic Respiratory Failure.  Patient is off the ventilator on nag on room air right now secretions are minimal reportedly  Medications: Reviewed on Rounds  Physical Exam:  Vitals: Temperature 96.0 pulse 91 respiratory 21 blood pressure 95/77 saturations 100%  Ventilator Settings off ventilator on room air  . General: Comfortable at this time . Eyes: Grossly normal lids, irises & conjunctiva . ENT: grossly tongue is normal . Neck: no obvious mass . Cardiovascular: S1 S2 normal no gallop . Respiratory: No rhonchi no rales are noted at this time . Abdomen: soft . Skin: no rash seen on limited exam . Musculoskeletal: not rigid . Psychiatric:unable to assess . Neurologic: no seizure no involuntary movements         Lab Data:   Basic Metabolic Panel: Recent Labs  Lab 08/16/19 0642  NA 145  K 4.0  CL 100  CO2 33*  GLUCOSE 154*  BUN 34*  CREATININE 0.73  CALCIUM 8.6*    ABG: No results for input(s): PHART, PCO2ART, PO2ART, HCO3, O2SAT in the last 168 hours.  Liver Function Tests: No results for input(s): AST, ALT, ALKPHOS, BILITOT, PROT, ALBUMIN in the last 168 hours. No results for input(s): LIPASE, AMYLASE in the last 168 hours. No results for input(s): AMMONIA in the last 168 hours.  CBC: Recent Labs  Lab 08/16/19 0642  WBC 11.5*  HGB 10.2*  HCT 32.9*  MCV 97.9  PLT 329    Cardiac Enzymes: No results for input(s): CKTOTAL, CKMB, CKMBINDEX, TROPONINI in the last 168 hours.  BNP (last 3 results) No results for input(s): BNP in the last 8760 hours.  ProBNP (last 3 results) No results for input(s): PROBNP in the last 8760  hours.  Radiological Exams: No results found.  Assessment/Plan Active Problems:   Acute on chronic respiratory failure with hypoxemia (HCC)   COVID-19 virus infection   Pneumonia due to COVID-19 virus   Chronic atrial fibrillation (HCC)   Acute renal failure (ARF) ([place])   1. Acute on chronic respiratory failure with hypoxia we will continue with mag device not requiring any oxygen continue with supportive care 2. COVID-19 virus infection in resolution phase 3. Pneumonia due to COVID-19 treated resolving 4. Chronic atrial fibrillation rate controlled 5. Acute renal failure we will continue with supportive care   I have personally seen and evaluated the patient, evaluated laboratory and imaging results, formulated the assessment and plan and placed orders. The Patient requires high complexity decision making for assessment and support.  Case was discussed on Rounds with the Respiratory Therapy Staff  Kevin Gee, MD [facility] Pulmonary Critical Care Medicine Sleep Medicine

## 2019-08-21 DIAGNOSIS — J9621 Acute and chronic respiratory failure with hypoxia: Secondary | ICD-10-CM | POA: Diagnosis not present

## 2019-08-21 DIAGNOSIS — I482 Chronic atrial fibrillation, unspecified: Secondary | ICD-10-CM | POA: Diagnosis not present

## 2019-08-21 DIAGNOSIS — N179 Acute kidney failure, unspecified: Secondary | ICD-10-CM | POA: Diagnosis not present

## 2019-08-21 DIAGNOSIS — U071 COVID-19: Secondary | ICD-10-CM | POA: Diagnosis not present

## 2019-08-21 LAB — VANCOMYCIN, TROUGH: Vancomycin Tr: 14 ug/mL — ABNORMAL LOW (ref 15–20)

## 2019-08-21 NOTE — Progress Notes (Signed)
Pulmonary Critical Care Medicine [place]   PULMONARY CRITICAL CARE SERVICE  PROGRESS NOTE  Date of Service: 08/21/2019  Kevin Middleton  KNL:[id number]  DOB: May 15, 1951   DOA: 07/28/2019  Referring Physician: Merton Border, MD  HPI: Kevin Middleton is a 68 y.o. male seen for follow up of Acute on Chronic Respiratory Failure.  Patient is doing fairly well has been using the nag device without any major issues  Medications: Reviewed on Rounds  Physical Exam:  Vitals: Temperature is 95.4 pulse 101 respiratory rate 23 blood pressure 161/90 saturations 98%  Ventilator Settings off the ventilator on nag device  . General: Comfortable at this time . Eyes: Grossly normal lids, irises & conjunctiva . ENT: grossly tongue is normal . Neck: no obvious mass . Cardiovascular: S1 S2 normal no gallop . Respiratory: No rhonchi coarse breath sounds are noted . Abdomen: soft . Skin: no rash seen on limited exam . Musculoskeletal: not rigid . Psychiatric:unable to assess . Neurologic: no seizure no involuntary movements         Lab Data:   Basic Metabolic Panel: Recent Labs  Lab 08/16/19 0642  NA 145  K 4.0  CL 100  CO2 33*  GLUCOSE 154*  BUN 34*  CREATININE 0.73  CALCIUM 8.6*    ABG: No results for input(s): PHART, PCO2ART, PO2ART, HCO3, O2SAT in the last 168 hours.  Liver Function Tests: No results for input(s): AST, ALT, ALKPHOS, BILITOT, PROT, ALBUMIN in the last 168 hours. No results for input(s): LIPASE, AMYLASE in the last 168 hours. No results for input(s): AMMONIA in the last 168 hours.  CBC: Recent Labs  Lab 08/16/19 0642  WBC 11.5*  HGB 10.2*  HCT 32.9*  MCV 97.9  PLT 329    Cardiac Enzymes: No results for input(s): CKTOTAL, CKMB, CKMBINDEX, TROPONINI in the last 168 hours.  BNP (last 3 results) No results for input(s): BNP in the last 8760 hours.  ProBNP (last 3 results) No results for input(s): PROBNP in the last 8760  hours.  Radiological Exams: No results found.  Assessment/Plan Active Problems:   Acute on chronic respiratory failure with hypoxemia (HCC)   COVID-19 virus infection   Pneumonia due to COVID-19 virus   Chronic atrial fibrillation (HCC)   Acute renal failure (ARF) ([place])   1. Acute on chronic respiratory failure with hypoxia patient continues to do well we will get a try to advance weaning to capping trials 2. COVID-19 virus infection treated we will continue with supportive care 3. Pneumonia due to COVID-19 treated resolving 4. Chronic atrial fibrillation rate controlled 5. Acute renal failure we will continue with supportive care   I have personally seen and evaluated the patient, evaluated laboratory and imaging results, formulated the assessment and plan and placed orders. The Patient requires high complexity decision making for assessment and support.  Case was discussed on Rounds with the Respiratory Therapy Staff  Allyne Gee, MD [facility] Pulmonary Critical Care Medicine Sleep Medicine

## 2019-08-22 DIAGNOSIS — J9621 Acute and chronic respiratory failure with hypoxia: Secondary | ICD-10-CM | POA: Diagnosis not present

## 2019-08-22 DIAGNOSIS — U071 COVID-19: Secondary | ICD-10-CM | POA: Diagnosis not present

## 2019-08-22 DIAGNOSIS — N179 Acute kidney failure, unspecified: Secondary | ICD-10-CM | POA: Diagnosis not present

## 2019-08-22 DIAGNOSIS — I482 Chronic atrial fibrillation, unspecified: Secondary | ICD-10-CM | POA: Diagnosis not present

## 2019-08-22 LAB — CBC
HCT: 36.2 % — ABNORMAL LOW (ref 39.0–52.0)
Hemoglobin: 11.2 g/dL — ABNORMAL LOW (ref 13.0–17.0)
MCH: 30.8 pg (ref 26.0–34.0)
MCHC: 30.9 g/dL (ref 30.0–36.0)
MCV: 99.5 fL (ref 80.0–100.0)
Platelets: 331 10*3/uL (ref 150–400)
RBC: 3.64 MIL/uL — ABNORMAL LOW (ref 4.22–5.81)
RDW: 17.5 % — ABNORMAL HIGH (ref 11.5–15.5)
WBC: 10.1 10*3/uL (ref 4.0–10.5)
nRBC: 0.3 % — ABNORMAL HIGH (ref 0.0–0.2)

## 2019-08-22 LAB — BASIC METABOLIC PANEL
Anion gap: 10 (ref 5–15)
BUN: 26 mg/dL — ABNORMAL HIGH (ref 8–23)
CO2: 25 mmol/L (ref 22–32)
Calcium: 8.3 mg/dL — ABNORMAL LOW (ref 8.9–10.3)
Chloride: 106 mmol/L (ref 98–111)
Creatinine, Ser: 0.79 mg/dL (ref 0.61–1.24)
GFR calc Af Amer: >60 mL/min (ref >60)
GFR calc non Af Amer: >60 mL/min (ref >60)
Glucose, Bld: 143 mg/dL — ABNORMAL HIGH (ref 70–99)
Potassium: 4.1 mmol/L (ref 3.5–5.1)
Sodium: 141 mmol/L (ref 135–145)

## 2019-08-22 NOTE — Progress Notes (Signed)
Pulmonary Critical Care Medicine [place]   PULMONARY CRITICAL CARE SERVICE  PROGRESS NOTE  Date of Service: 08/22/2019  Kevin Middleton  DTO:[id number]  DOB: 01-06-1951   DOA: 07/28/2019  Referring Physician: Merton Border, MD  HPI: Kevin Middleton is a 68 y.o. male seen for follow up of Acute on Chronic Respiratory Failure.  Patient currently is nag collar comfortable without distress at this time.  Secretions are improvement  Medications: Reviewed on Rounds  Physical Exam:  Vitals: Temperature 96.0 pulse 101 respiratory rate 21 blood pressure 126/88 saturations 99%  Ventilator Settings off the ventilator try capping  . General: Comfortable at this time . Eyes: Grossly normal lids, irises & conjunctiva . ENT: grossly tongue is normal . Neck: no obvious mass . Cardiovascular: S1 S2 normal no gallop . Respiratory: No rhonchi no rales are noted at this time . Abdomen: soft . Skin: no rash seen on limited exam . Musculoskeletal: not rigid . Psychiatric:unable to assess . Neurologic: no seizure no involuntary movements         Lab Data:   Basic Metabolic Panel: Recent Labs  Lab 08/16/19 0642 08/22/19 0548  NA 145 141  K 4.0 4.1  CL 100 106  CO2 33* 25  GLUCOSE 154* 143*  BUN 34* 26*  CREATININE 0.73 0.79  CALCIUM 8.6* 8.3*    ABG: No results for input(s): PHART, PCO2ART, PO2ART, HCO3, O2SAT in the last 168 hours.  Liver Function Tests: No results for input(s): AST, ALT, ALKPHOS, BILITOT, PROT, ALBUMIN in the last 168 hours. No results for input(s): LIPASE, AMYLASE in the last 168 hours. No results for input(s): AMMONIA in the last 168 hours.  CBC: Recent Labs  Lab 08/16/19 0642 08/22/19 0548  WBC 11.5* 10.1  HGB 10.2* 11.2*  HCT 32.9* 36.2*  MCV 97.9 99.5  PLT 329 331    Cardiac Enzymes: No results for input(s): CKTOTAL, CKMB, CKMBINDEX, TROPONINI in the last 168 hours.  BNP (last 3 results) No results for input(s): BNP  in the last 8760 hours.  ProBNP (last 3 results) No results for input(s): PROBNP in the last 8760 hours.  Radiological Exams: No results found.  Assessment/Plan Active Problems:   Acute on chronic respiratory failure with hypoxemia (HCC)   COVID-19 virus infection   Pneumonia due to COVID-19 virus   Chronic atrial fibrillation (HCC)   Acute renal failure (ARF) ([place])   1. Acute on chronic respiratory failure with hypoxia plan is to continue with advancing the weaning continue secretion management pulmonary toilet. 2. COVID-19 virus infection treated we will continue with supportive care 3. Pneumonia due to COVID-19 treated we will continue present management 4. Chronic atrial fibrillation rate is controlled 5. Acute renal failure resolved we will continue with supportive care   I have personally seen and evaluated the patient, evaluated laboratory and imaging results, formulated the assessment and plan and placed orders. The Patient requires high complexity decision making for assessment and support.  Case was discussed on Rounds with the Respiratory Therapy Staff  Allyne Gee, MD [facility] Pulmonary Critical Care Medicine Sleep Medicine

## 2019-08-23 ENCOUNTER — Other Ambulatory Visit (HOSPITAL_COMMUNITY): Payer: Medicare Other

## 2019-08-23 DIAGNOSIS — U071 COVID-19: Secondary | ICD-10-CM | POA: Diagnosis not present

## 2019-08-23 DIAGNOSIS — N179 Acute kidney failure, unspecified: Secondary | ICD-10-CM | POA: Diagnosis not present

## 2019-08-23 DIAGNOSIS — J9621 Acute and chronic respiratory failure with hypoxia: Secondary | ICD-10-CM | POA: Diagnosis not present

## 2019-08-23 DIAGNOSIS — I482 Chronic atrial fibrillation, unspecified: Secondary | ICD-10-CM | POA: Diagnosis not present

## 2019-08-23 NOTE — Progress Notes (Signed)
Pulmonary Critical Care Medicine [place]   PULMONARY CRITICAL CARE SERVICE  PROGRESS NOTE  Date of Service: 08/23/2019  TOSHIYUKI FREDELL  POE:[id number]  DOB: [birth year]   DOA: 07/28/2019  Referring Physician: Merton Border, MD  HPI: JOSEJUAN HOAGLIN is a 68 y.o. male seen for follow up of Acute on Chronic Respiratory Failure.  Patient is capping doing well has been on room air no distress is noted at this time  Medications: Reviewed on Rounds  Physical Exam:  Vitals: Temperature 96.0 pulse 101 respiratory 21 blood pressure 148/81 saturations are 97%  Ventilator Settings capping off the ventilator right now on room air  . General: Comfortable at this time . Eyes: Grossly normal lids, irises & conjunctiva . ENT: grossly tongue is normal . Neck: no obvious mass . Cardiovascular: S1 S2 normal no gallop . Respiratory: No rhonchi no rales are noted at this time . Abdomen: soft . Skin: no rash seen on limited exam . Musculoskeletal: not rigid . Psychiatric:unable to assess . Neurologic: no seizure no involuntary movements         Lab Data:   Basic Metabolic Panel: Recent Labs  Lab 08/22/19 0548  NA 141  K 4.1  CL 106  CO2 25  GLUCOSE 143*  BUN 26*  CREATININE 0.79  CALCIUM 8.3*    ABG: No results for input(s): PHART, PCO2ART, PO2ART, HCO3, O2SAT in the last 168 hours.  Liver Function Tests: No results for input(s): AST, ALT, ALKPHOS, BILITOT, PROT, ALBUMIN in the last 168 hours. No results for input(s): LIPASE, AMYLASE in the last 168 hours. No results for input(s): AMMONIA in the last 168 hours.  CBC: Recent Labs  Lab 08/22/19 0548  WBC 10.1  HGB 11.2*  HCT 36.2*  MCV 99.5  PLT 331    Cardiac Enzymes: No results for input(s): CKTOTAL, CKMB, CKMBINDEX, TROPONINI in the last 168 hours.  BNP (last 3 results) No results for input(s): BNP in the last 8760 hours.  ProBNP (last 3 results) No results for input(s): PROBNP in the  last 8760 hours.  Radiological Exams: No results found.  Assessment/Plan Active Problems:   Acute on chronic respiratory failure with hypoxemia (HCC)   COVID-19 virus infection   Pneumonia due to COVID-19 virus   Chronic atrial fibrillation (HCC)   Acute renal failure (ARF) ([place])   1. Acute on chronic respiratory failure with hypoxia plan is to continue with capping trials patient has tolerated well 2. COVID-19 virus infection at baseline we will continue with present management 3. Pneumonia due to COVID-19 treated clinically is improving 4. Chronic atrial fibrillation rate controlled 5. Acute renal failure continue present management   I have personally seen and evaluated the patient, evaluated laboratory and imaging results, formulated the assessment and plan and placed orders. The Patient requires high complexity decision making for assessment and support.  Case was discussed on Rounds with the Respiratory Therapy Staff  Allyne Gee, MD [facility] Pulmonary Critical Care Medicine Sleep Medicine

## 2019-08-24 ENCOUNTER — Other Ambulatory Visit (HOSPITAL_COMMUNITY): Payer: Medicare Other

## 2019-08-24 DIAGNOSIS — J9621 Acute and chronic respiratory failure with hypoxia: Secondary | ICD-10-CM | POA: Diagnosis not present

## 2019-08-24 DIAGNOSIS — N179 Acute kidney failure, unspecified: Secondary | ICD-10-CM | POA: Diagnosis not present

## 2019-08-24 DIAGNOSIS — I482 Chronic atrial fibrillation, unspecified: Secondary | ICD-10-CM | POA: Diagnosis not present

## 2019-08-24 DIAGNOSIS — U071 COVID-19: Secondary | ICD-10-CM | POA: Diagnosis not present

## 2019-08-24 NOTE — Progress Notes (Signed)
Pulmonary Critical Care Medicine [place]   PULMONARY CRITICAL CARE SERVICE  PROGRESS NOTE  Date of Service: 08/24/2019  SHAY BARTOLI  TDD:[id number]  DOB: 1951/02/23   DOA: 07/28/2019  Referring Physician: Merton Border, MD  HPI: Kevin Middleton is a 68 y.o. male seen for follow up of Acute on Chronic Respiratory Failure.  Patient is doing well with capping has been on room air we should be able to proceed to decannulation today  Medications: Reviewed on Rounds  Physical Exam:  Vitals: Temperature 97.8 pulse 83 respiratory 25 blood pressure 141/59 saturations 98%  Ventilator Settings capping right now on room air  . General: Comfortable at this time . Eyes: Grossly normal lids, irises & conjunctiva . ENT: grossly tongue is normal . Neck: no obvious mass . Cardiovascular: S1 S2 normal no gallop . Respiratory: No rhonchi coarse breath sounds are noted at this time . Abdomen: soft . Skin: no rash seen on limited exam . Musculoskeletal: not rigid . Psychiatric:unable to assess . Neurologic: no seizure no involuntary movements         Lab Data:   Basic Metabolic Panel: Recent Labs  Lab 08/22/19 0548  NA 141  K 4.1  CL 106  CO2 25  GLUCOSE 143*  BUN 26*  CREATININE 0.79  CALCIUM 8.3*    ABG: No results for input(s): PHART, PCO2ART, PO2ART, HCO3, O2SAT in the last 168 hours.  Liver Function Tests: No results for input(s): AST, ALT, ALKPHOS, BILITOT, PROT, ALBUMIN in the last 168 hours. No results for input(s): LIPASE, AMYLASE in the last 168 hours. No results for input(s): AMMONIA in the last 168 hours.  CBC: Recent Labs  Lab 08/22/19 0548  WBC 10.1  HGB 11.2*  HCT 36.2*  MCV 99.5  PLT 331    Cardiac Enzymes: No results for input(s): CKTOTAL, CKMB, CKMBINDEX, TROPONINI in the last 168 hours.  BNP (last 3 results) No results for input(s): BNP in the last 8760 hours.  ProBNP (last 3 results) No results for input(s): PROBNP  in the last 8760 hours.  Radiological Exams: No results found.  Assessment/Plan Active Problems:   Acute on chronic respiratory failure with hypoxemia (HCC)   COVID-19 virus infection   Pneumonia due to COVID-19 virus   Chronic atrial fibrillation (HCC)   Acute renal failure (ARF) ([place])   1. Acute on chronic respiratory failure hypoxia we will continue with advancing the wean and proceed to decannulation 2. COVID-19 virus infection resolved 3. Pneumonia treated resolved 4. Chronic atrial fibrillation rate controlled 5. Acute renal failure improved   I have personally seen and evaluated the patient, evaluated laboratory and imaging results, formulated the assessment and plan and placed orders. The Patient requires high complexity decision making for assessment and support.  Case was discussed on Rounds with the Respiratory Therapy Staff  Allyne Gee, MD [facility] Pulmonary Critical Care Medicine Sleep Medicine

## 2019-08-25 LAB — NOVEL CORONAVIRUS, NAA (HOSP ORDER, SEND-OUT TO REF LAB; TAT 18-24 HRS): SARS-CoV-2, NAA: NOT DETECTED

## 2020-03-01 IMAGING — DX DG CHEST 1V PORT
1 series · 1 of 1 positions shown · non-contrast
Comparison: Radiograph 08/02/2019

CLINICAL DATA: Pneumonia

EXAM:
PORTABLE CHEST 1 VIEW

[chest]
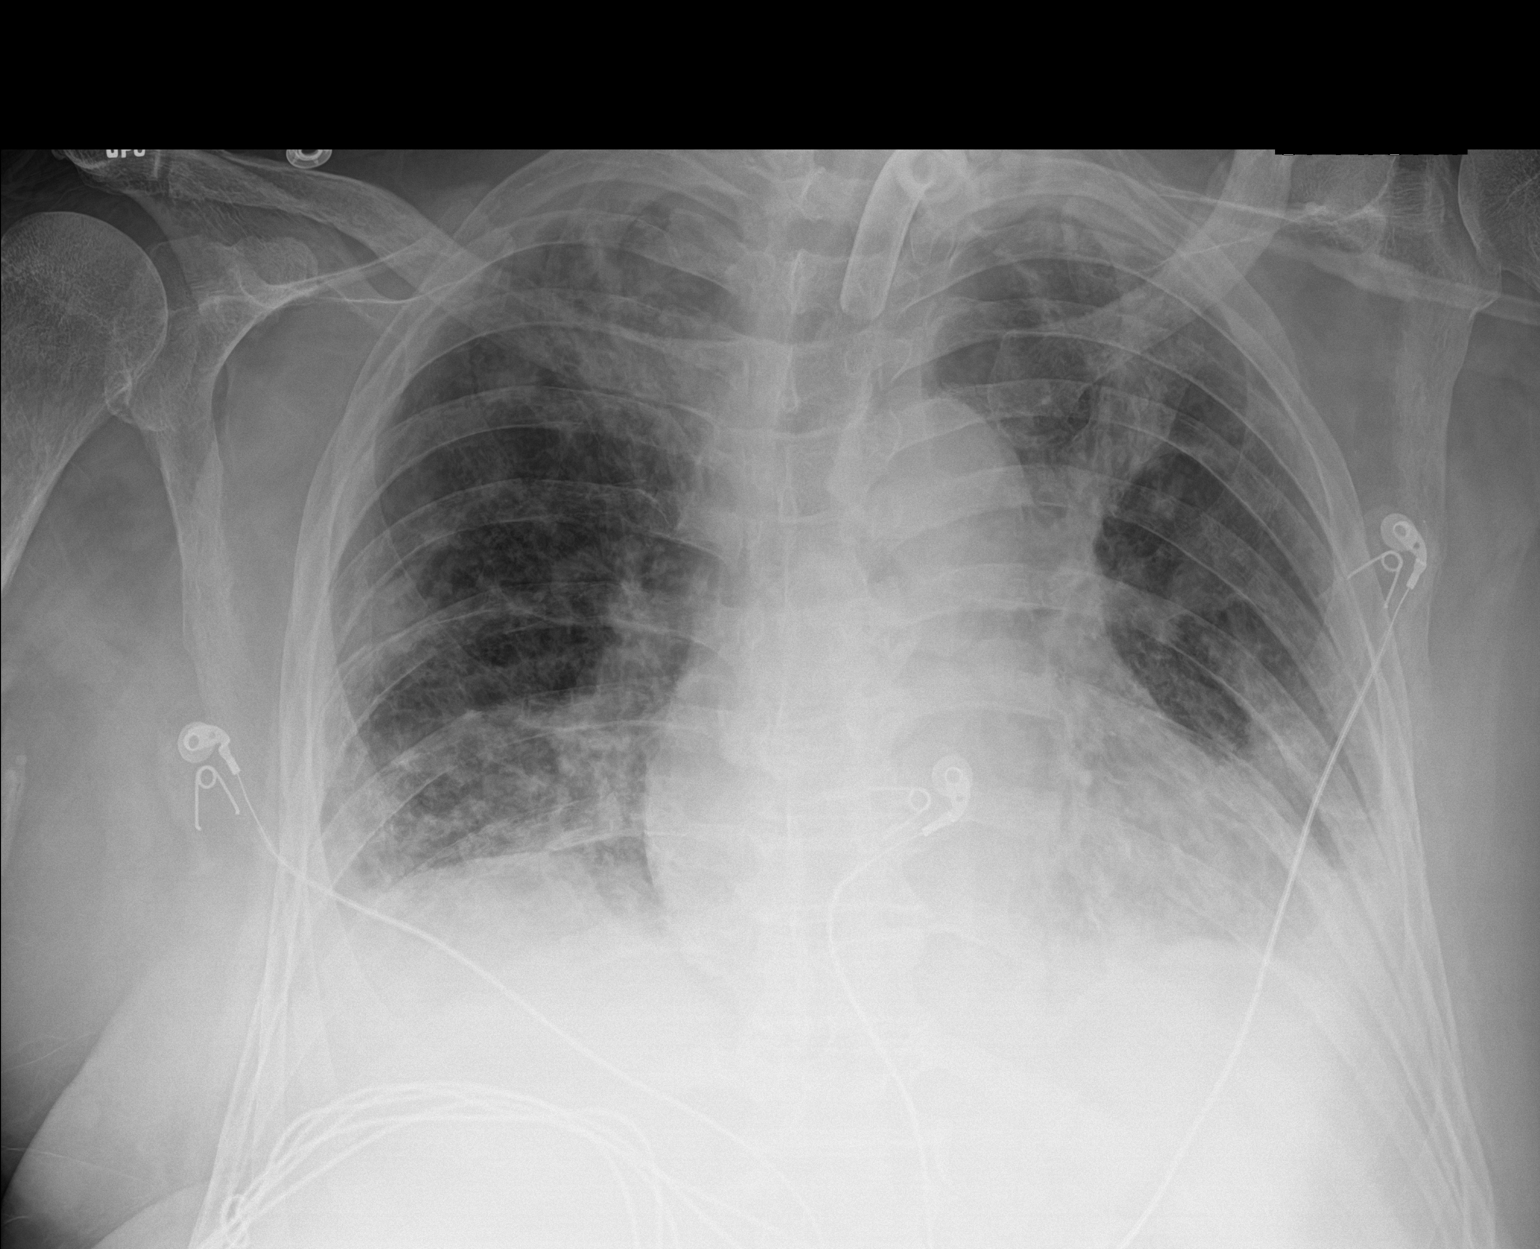

[1 of 1 positions shown; findings below may reference images not displayed]

FINDINGS: Tracheostomy tube terminates in the mid to upper trachea. Persistent
multifocal areas of airspace disease, overall increased from
comparison radiography 08/02/2019 most notably in the left upper
lung and left lung base. Suspect bilateral pleural effusions. No
pneumothorax. Cardiomediastinal contours are unchanged. Degenerative
changes are present in the imaged spine and shoulders. No acute
osseous or soft tissue abnormality.
IMPRESSION: 1. Persistent multifocal areas of airspace disease, overall
increased from comparison radiography 08/02/2019, most notably in
the left upper lung and left lung base.
2. Suspect bilateral pleural effusions.

## 2021-07-17 DEATH — deceased
